# Patient Record
Sex: Male | Born: 1950 | Race: White | Hispanic: No | Marital: Married
Health system: Midwestern US, Academic
[De-identification: ages and names within clinical notes are randomized; demographics above are authoritative.]

---

## 2016-07-12 MED ORDER — EZETIMIBE 10 MG PO TAB
10 mg | ORAL_TABLET | Freq: Every day | ORAL | 3 refills | Status: DC
Start: 2016-07-12 — End: 2017-08-01

## 2016-08-07 ENCOUNTER — Ambulatory Visit: Admit: 2016-08-07 | Discharge: 2016-08-07 | Payer: MEDICARE

## 2016-08-07 DIAGNOSIS — R1310 Dysphagia, unspecified: Principal | ICD-10-CM

## 2016-08-07 NOTE — Progress Notes
Reviewed pre procedure instructions with patient, patient verbalizes good understanding.  instructed patient to avoid blood thinning medications, Celebrex, for the seven days prior to his procedure.  Patient states he is no longer taking Plavix.  Requested patient speak to Physician regarding his Insulin and to follow their instructions. Patient verbalizes Understanding and agrees.          [facility]    PATIENT EDUCATION ??? COLONOSCOPY     A colonoscopy is a scope examination of your colon or large intestine.  This is about 6 feet long in most adults.        ? A thin, flexible scope with a light and lens will be inserted through your rectum into your colon.  ? The doctor will guide the scope along your colon to the junction of the large and small intestines.  ? The test usually takes 30 minutes.    ? Unless the test is prescribed for other reasons, the main purpose of the colonoscopy is to search for polyps.                                           ? A Colonoscopy can be used to screen for cancer or precancerous polyps.  It can also be used to assess the large intestine for various other diseases.  ? If biopsies are obtained during the procedure, you will be notified of the results in approximately 14 days.   ? We will sedate you for the test by giving you medicines through an IV that will help you to relax and sleep.  We will keep you after the exam for approximately 30 minutes.  ? The doctor will speak to you before and after the procedure.  ? Unless you request otherwise, we will invite your driver to the recovery room to hear the doctor???s post-exam report.  ? The colon must be completely clean for the procedure to be both accurate and comprehensive. Please follow the preparation instructions carefully.        SPLIT DOSE NULYTELY/GOLYTELY PREP (Recommended)  PATIENT PREPARATION INSTRUCTIONS??? COLONOSCOPY    To reduce the risk of bleeding, please discuss with your prescribing physician how to stop blood thinners such as Aspirin, Ibuprofen, Plavix, Aleve, Naproxen, and Coumadin before the test.  Lovenox injections may be taken as usual through the day before the test.  Do NOT give yourself a Lovenox injection the morning of the test.  If any of these medicines have been ordered by your doctor, you MUST check with the prescribing doctor to make sure it is okay to stop them.    Discuss Diabetic medications with the prescribing doctor.  ? Please do not eat any foods that have nuts, seeds, or kernels for 7 days before your colonoscopy, as this may interfere with the quality of your test.  ? Stop iron pills 7 days prior to the test (you may continue to take multivitamins that contain iron).  ? Stop any bulk laxatives/fiber supplements such as Metamucil, Citrucel, Benefiber, etc. 7 days before the test.    ? You will need to purchase the following items (requires a prescription):                The day BEFORE the test:  08/19/2016  DO NOT EAT ANY SOLID OR CREAMED FOODS. NO RED, PURPLE OR ORANGE ITEMS ARE PERMITTED.  You may  drink all the clear liquids that you desire.                 Timeline:  ? 08/19/2016 - The day BEFORE your procedure:  Start a clear liquid diet. Drink a generous amount of clear liquids throughout the day.  No solid food or alcohol.     ? 08/19/2016 - At 11:00 AM the day BEFORE your procedure:  Fill the Nulytely/Golytely jug up to the ???fill??? line.  Place the jug in the refrigerator  ? 08/19/2016 - At 5:00 p.m. the day before your procedure:  Drink one 8 ounce glass of Nulytely/Golytely every 10 minutes until ??? of the gallon of fluid is fully consumed (approximately 3 liter). Place remaining Nulytely/Golytely in refrigerator.  ? At 7:00 AM on 08/20/2016 - 5 hours before your scheduled procedure time:   Drink one 8 ounce glass of Nulytely/Golytely every 10 minutes until there is no fluid remaining. Continue to drink lots of clear fluids. ? You may drink clear liquids up until 8:00 AM, 4 hours before your scheduled procedure time. After 8:00 AM you should have nothing by mouth including no gum or candy.  ? Chewing tobacco must be stopped 6 hours before your scheduled procedure.  If you have an early morning test, take only your essential morning medicines (heart, blood pressure, etc.) with a small sip of water by 8:00 AM.  Discuss management of diabetic medications with your prescribing physician.  On the day of the test, please report at your scheduled time to the Admissions area of the facility.  ? You will be sedated for the procedure, so a responsible adult must drive you home (no Benedetto Goad, taxis or buses are permitted).  We require that the driver stay during the time that you are here.  If you do not have a driver we will be unable to do the test.    ? You will be here for 3-4 hours from arrival time.  ? You will not be able to return to work the same day if you have received sedation.  ? Please bring a list of your current medicines and the dosages with you.    Appointment Date:  Monday, August 20, 2016 at 12:00 PM  Physician:   Dr. Virgina Organ    Appointment Arrival time:   10:30 AM, Check in at Admissions  Location:  [facility] [address] [place],  [address] [place], [place]., [address]    If you are scheduled at the [facility] and have any questions or need to reschedule, please call [phone] Option #3.   If you have completed your prep and your stools aren???t yellow or clear (without debris) call [phone] Option #3 for Main

## 2016-09-20 ENCOUNTER — Encounter: Admit: 2016-09-20 | Discharge: 2016-09-20 | Payer: MEDICARE

## 2017-01-16 LAB — LIPID PROFILE
Lab: 154 — ABNORMAL LOW (ref 98–107)
Lab: 27 — ABNORMAL HIGH (ref 80–115)
Lab: 4
Lab: 88 — ABNORMAL HIGH (ref 0.72–1.25)

## 2017-01-16 LAB — HEMOGLOBIN A1C: Lab: 9.3 — ABNORMAL HIGH (ref 4.5–6.5)

## 2017-01-16 LAB — COMPREHENSIVE METABOLIC PANEL
Lab: 0.7 — ABNORMAL LOW (ref 3.4–4.8)
Lab: 12
Lab: 12
Lab: 14
Lab: 140
Lab: 3.5
Lab: 48
Lab: 6 — ABNORMAL LOW (ref 6.2–8.1)

## 2017-01-16 LAB — PROSTATIC SPECIFIC ANTIGEN-PSA: Lab: 0.7

## 2017-01-28 LAB — CBC: Lab: 3.9 — ABNORMAL LOW (ref 4.8–10.8)

## 2017-01-28 LAB — COMPREHENSIVE METABOLIC PANEL: Lab: 134 — ABNORMAL LOW (ref 136–145)

## 2017-01-29 ENCOUNTER — Encounter: Admit: 2017-01-29 | Discharge: 2017-01-29 | Payer: MEDICARE

## 2017-05-10 LAB — COMPREHENSIVE METABOLIC PANEL
Lab: 0.5
Lab: 1.4 — ABNORMAL HIGH (ref 0.72–1.25)
Lab: 10
Lab: 102
Lab: 138
Lab: 22 — ABNORMAL LOW (ref 23–31)
Lab: 24 — ABNORMAL HIGH (ref 27.0–31.0)
Lab: 245 — ABNORMAL HIGH (ref 80–115)
Lab: 3.9
Lab: 4.2
Lab: 7

## 2017-05-10 LAB — CBC: Lab: 9

## 2017-07-16 ENCOUNTER — Encounter: Admit: 2017-07-16 | Discharge: 2017-07-16 | Payer: MEDICARE

## 2017-08-01 ENCOUNTER — Encounter: Admit: 2017-08-01 | Discharge: 2017-08-01 | Payer: MEDICARE

## 2017-08-01 ENCOUNTER — Ambulatory Visit: Admit: 2017-08-01 | Discharge: 2017-08-02 | Payer: MEDICARE

## 2017-08-01 DIAGNOSIS — I420 Dilated cardiomyopathy: ICD-10-CM

## 2017-08-01 DIAGNOSIS — E785 Hyperlipidemia, unspecified: ICD-10-CM

## 2017-08-01 DIAGNOSIS — G4733 Obstructive sleep apnea (adult) (pediatric): ICD-10-CM

## 2017-08-01 DIAGNOSIS — R0789 Other chest pain: ICD-10-CM

## 2017-08-01 DIAGNOSIS — Z9889 Other specified postprocedural states: ICD-10-CM

## 2017-08-01 DIAGNOSIS — M1990 Unspecified osteoarthritis, unspecified site: ICD-10-CM

## 2017-08-01 DIAGNOSIS — E119 Type 2 diabetes mellitus without complications: ICD-10-CM

## 2017-08-01 DIAGNOSIS — I509 Heart failure, unspecified: ICD-10-CM

## 2017-08-01 DIAGNOSIS — I2510 Atherosclerotic heart disease of native coronary artery without angina pectoris: ICD-10-CM

## 2017-08-02 LAB — COMPREHENSIVE METABOLIC PANEL
Lab: 0.4
Lab: 106
Lab: 141
Lab: 15 — ABNORMAL HIGH (ref 0–14)
Lab: 18
Lab: 23
Lab: 25
Lab: 25
Lab: 3.7
Lab: 4.5

## 2017-08-02 LAB — LIPID PROFILE
Lab: 125 — ABNORMAL LOW (ref 150–200)
Lab: 3
Lab: 49
Lab: 55 — ABNORMAL HIGH (ref 80–115)
Lab: 61

## 2017-08-07 ENCOUNTER — Ambulatory Visit: Admit: 2017-08-07 | Discharge: 2017-08-08 | Payer: MEDICARE

## 2017-08-07 ENCOUNTER — Encounter: Admit: 2017-08-07 | Discharge: 2017-08-07 | Payer: MEDICARE

## 2017-08-07 DIAGNOSIS — E119 Type 2 diabetes mellitus without complications: ICD-10-CM

## 2017-08-07 DIAGNOSIS — I2510 Atherosclerotic heart disease of native coronary artery without angina pectoris: ICD-10-CM

## 2017-08-07 DIAGNOSIS — R0789 Other chest pain: Principal | ICD-10-CM

## 2017-08-07 DIAGNOSIS — I420 Dilated cardiomyopathy: Principal | ICD-10-CM

## 2017-08-07 DIAGNOSIS — E785 Hyperlipidemia, unspecified: ICD-10-CM

## 2017-08-12 ENCOUNTER — Encounter: Admit: 2017-08-12 | Discharge: 2017-08-12 | Payer: MEDICARE

## 2017-10-10 ENCOUNTER — Ambulatory Visit: Admit: 2017-10-10 | Discharge: 2017-10-11 | Payer: MEDICARE

## 2017-10-10 DIAGNOSIS — I420 Dilated cardiomyopathy: Principal | ICD-10-CM

## 2017-10-10 DIAGNOSIS — I2510 Atherosclerotic heart disease of native coronary artery without angina pectoris: ICD-10-CM

## 2017-10-11 ENCOUNTER — Encounter: Admit: 2017-10-11 | Discharge: 2017-10-11 | Payer: MEDICARE

## 2017-10-11 DIAGNOSIS — I2510 Atherosclerotic heart disease of native coronary artery without angina pectoris: ICD-10-CM

## 2017-10-11 DIAGNOSIS — I420 Dilated cardiomyopathy: Principal | ICD-10-CM

## 2019-09-10 ENCOUNTER — Encounter: Admit: 2019-09-10 | Discharge: 2019-09-10 | Payer: MEDICARE

## 2019-09-10 ENCOUNTER — Ambulatory Visit: Admit: 2019-09-10 | Discharge: 2019-09-10 | Payer: MEDICARE

## 2019-09-10 DIAGNOSIS — I509 Heart failure, unspecified: Secondary | ICD-10-CM

## 2019-09-10 DIAGNOSIS — R109 Unspecified abdominal pain: Principal | ICD-10-CM

## 2019-09-10 DIAGNOSIS — I420 Dilated cardiomyopathy: Secondary | ICD-10-CM

## 2019-09-10 DIAGNOSIS — E119 Type 2 diabetes mellitus without complications: Secondary | ICD-10-CM

## 2019-09-10 DIAGNOSIS — R0789 Other chest pain: Secondary | ICD-10-CM

## 2019-09-10 DIAGNOSIS — M1990 Unspecified osteoarthritis, unspecified site: Secondary | ICD-10-CM

## 2019-09-10 DIAGNOSIS — G4733 Obstructive sleep apnea (adult) (pediatric): Secondary | ICD-10-CM

## 2019-09-10 DIAGNOSIS — E785 Hyperlipidemia, unspecified: Secondary | ICD-10-CM

## 2019-09-10 DIAGNOSIS — K529 Noninfective gastroenteritis and colitis, unspecified: Secondary | ICD-10-CM

## 2019-09-10 DIAGNOSIS — Z9889 Other specified postprocedural states: Secondary | ICD-10-CM

## 2019-09-11 LAB — COMPREHENSIVE METABOLIC PANEL
Lab: 0.5 mg/dL (ref 0.3–1.2)
Lab: 1.4 mg/dL — ABNORMAL HIGH (ref 0.4–1.24)
Lab: 10 U/L (ref 7–56)
Lab: 104 MMOL/L (ref 98–110)
Lab: 11 10*3/uL (ref 3–12)
Lab: 11 U/L (ref 7–40)
Lab: 140 MMOL/L — ABNORMAL LOW (ref 137–147)
Lab: 188 mg/dL — ABNORMAL HIGH (ref 70–100)
Lab: 25 MMOL/L (ref 21–30)
Lab: 29 mg/dL — ABNORMAL HIGH (ref 7–25)
Lab: 4.2 g/dL (ref 3.5–5.0)
Lab: 49 U/L (ref 25–110)
Lab: 5 MMOL/L — ABNORMAL LOW (ref 3.5–5.1)
Lab: 50 mL/min — ABNORMAL LOW (ref >60)
Lab: 6.6 g/dL (ref 6.0–8.0)
Lab: 9.2 mg/dL (ref 8.5–10.6)
Lab: >60 mL/min (ref >60)

## 2019-09-11 LAB — CBC AND DIFF
Lab: 0 10*3/uL (ref 0–0.20)
Lab: 4.1 M/UL — ABNORMAL LOW (ref 4.4–5.5)
Lab: 5.7 10*3/uL (ref 4.5–11.0)

## 2019-09-15 ENCOUNTER — Encounter: Admit: 2019-09-15 | Discharge: 2019-09-15 | Payer: MEDICARE

## 2019-09-16 ENCOUNTER — Ambulatory Visit: Admit: 2019-09-16 | Discharge: 2019-09-16 | Payer: MEDICARE

## 2019-09-16 ENCOUNTER — Encounter: Admit: 2019-09-16 | Discharge: 2019-09-16 | Payer: MEDICARE

## 2019-09-16 DIAGNOSIS — K529 Noninfective gastroenteritis and colitis, unspecified: Secondary | ICD-10-CM

## 2019-09-16 DIAGNOSIS — R109 Unspecified abdominal pain: Secondary | ICD-10-CM

## 2019-09-16 LAB — POC CREATININE, RAD: Lab: 1.5 mg/dL — ABNORMAL HIGH (ref 0.4–1.24)

## 2019-09-16 MED ORDER — IOHEXOL 350 MG IODINE/ML IV SOLN
100 mL | Freq: Once | INTRAVENOUS | 0 refills | Status: CP
Start: 2019-09-16 — End: ?
  Administered 2019-09-16: 19:00:00 100 mL via INTRAVENOUS

## 2019-09-16 MED ORDER — SODIUM CHLORIDE 0.9 % IJ SOLN
50 mL | Freq: Once | INTRAVENOUS | 0 refills | Status: CP
Start: 2019-09-16 — End: ?
  Administered 2019-09-16: 19:00:00 50 mL via INTRAVENOUS

## 2019-09-20 ENCOUNTER — Encounter: Admit: 2019-09-20 | Discharge: 2019-09-20 | Payer: MEDICARE

## 2019-09-21 ENCOUNTER — Encounter: Admit: 2019-09-21 | Discharge: 2019-09-21 | Payer: MEDICARE

## 2019-09-21 DIAGNOSIS — R109 Unspecified abdominal pain: Secondary | ICD-10-CM

## 2019-09-21 MED ORDER — LIPASE-PROTEASE-AMYLASE (CREON 36,000) 36,000-114,000- 180,000 UNIT PO CPDR
ORAL_CAPSULE | Freq: Three times a day (TID) | ORAL | 1 refills | 30.00000 days | Status: AC
Start: 2019-09-21 — End: ?

## 2019-09-25 ENCOUNTER — Encounter: Admit: 2019-09-25 | Discharge: 2019-09-25 | Payer: MEDICARE

## 2019-09-25 NOTE — Telephone Encounter
Patient called asking to schedule appt with [facility] for further evaluation of chest discomfort/burning.  Pt was seen and evaluated today by PCP and was advised to see SDO.      Pt reports onset of intermittent chest discomfort approx 2 weeks ago.  He reports some dyspnea.  Pain is intermittent.  Pain doesn't radiate. Pt scheduled to see SDO next week in [place] for further evaluation.  Advised wife, if sx increase to go to ER for evaluation. She verbalizes understanding. BP today at PCP's office was 112/70.

## 2019-10-01 ENCOUNTER — Encounter: Admit: 2019-10-01 | Discharge: 2019-10-01 | Payer: MEDICARE

## 2019-10-01 DIAGNOSIS — R0789 Other chest pain: Secondary | ICD-10-CM

## 2019-10-01 DIAGNOSIS — E785 Hyperlipidemia, unspecified: Secondary | ICD-10-CM

## 2019-10-01 DIAGNOSIS — M1990 Unspecified osteoarthritis, unspecified site: Secondary | ICD-10-CM

## 2019-10-01 DIAGNOSIS — G4733 Obstructive sleep apnea (adult) (pediatric): Secondary | ICD-10-CM

## 2019-10-01 DIAGNOSIS — I2510 Atherosclerotic heart disease of native coronary artery without angina pectoris: Secondary | ICD-10-CM

## 2019-10-01 DIAGNOSIS — I509 Heart failure, unspecified: Secondary | ICD-10-CM

## 2019-10-01 DIAGNOSIS — Z9889 Other specified postprocedural states: Secondary | ICD-10-CM

## 2019-10-01 DIAGNOSIS — I420 Dilated cardiomyopathy: Secondary | ICD-10-CM

## 2019-10-01 DIAGNOSIS — E119 Type 2 diabetes mellitus without complications: Secondary | ICD-10-CM

## 2019-10-01 DIAGNOSIS — R079 Chest pain, unspecified: Secondary | ICD-10-CM

## 2019-10-01 DIAGNOSIS — R0600 Dyspnea, unspecified: Secondary | ICD-10-CM

## 2019-10-02 ENCOUNTER — Encounter: Admit: 2019-10-02 | Discharge: 2019-10-02 | Payer: MEDICARE

## 2019-12-24 ENCOUNTER — Encounter: Admit: 2019-12-24 | Discharge: 2019-12-24 | Payer: MEDICARE

## 2019-12-24 ENCOUNTER — Ambulatory Visit: Admit: 2019-12-24 | Discharge: 2019-12-25 | Payer: MEDICARE

## 2019-12-24 DIAGNOSIS — G4733 Obstructive sleep apnea (adult) (pediatric): Secondary | ICD-10-CM

## 2019-12-24 DIAGNOSIS — M1990 Unspecified osteoarthritis, unspecified site: Secondary | ICD-10-CM

## 2019-12-24 DIAGNOSIS — E119 Type 2 diabetes mellitus without complications: Secondary | ICD-10-CM

## 2019-12-24 DIAGNOSIS — R109 Unspecified abdominal pain: Secondary | ICD-10-CM

## 2019-12-24 DIAGNOSIS — R0789 Other chest pain: Secondary | ICD-10-CM

## 2019-12-24 DIAGNOSIS — N289 Disorder of kidney and ureter, unspecified: Secondary | ICD-10-CM

## 2019-12-24 DIAGNOSIS — E785 Hyperlipidemia, unspecified: Secondary | ICD-10-CM

## 2019-12-24 DIAGNOSIS — I509 Heart failure, unspecified: Secondary | ICD-10-CM

## 2019-12-24 DIAGNOSIS — Z9889 Other specified postprocedural states: Secondary | ICD-10-CM

## 2019-12-24 DIAGNOSIS — I420 Dilated cardiomyopathy: Secondary | ICD-10-CM

## 2019-12-24 NOTE — Patient Instructions
Complete lab.     Follow up with Dr. Guss Bunde in 6 months. You will be contacted to schedule this appointment by a patient service representative. If you are not contacted in the next 2 weeks please call our office for assistance.     As part of the CARES act, starting April 1st some results are released to you automatically. Your provider will continue to send you a detailed result note on any labs that they order, but with these changes you may see your results before they do. Critical lab results will be addressed immediately, but otherwise please give your provider 72 hours (3 business days) to view and respond to your results before reaching out with any questions. Depending on your questions, they may ask you to schedule a telehealth or telephone visit to discuss further. This visit may be billed to your insurance depending on time and complexity.     If you have internet access/ smartphone please contact me through MyChart. This is our preferred method of contact and the most efficient way to contact your healthcare team. If you do not have internet access/smartphone you can call at [phone]. Please do not leave multiple voicemail's for [place]. Leaving multiple voicemail's delays [place] getting back to you quicker.

## 2020-09-22 ENCOUNTER — Encounter: Admit: 2020-09-22 | Discharge: 2020-09-22 | Payer: MEDICARE

## 2020-09-22 ENCOUNTER — Ambulatory Visit: Admit: 2020-09-22 | Discharge: 2020-09-23 | Payer: MEDICARE

## 2020-09-22 DIAGNOSIS — E119 Type 2 diabetes mellitus without complications: Secondary | ICD-10-CM

## 2020-09-22 DIAGNOSIS — I420 Dilated cardiomyopathy: Secondary | ICD-10-CM

## 2020-09-22 DIAGNOSIS — I509 Heart failure, unspecified: Secondary | ICD-10-CM

## 2020-09-22 DIAGNOSIS — E785 Hyperlipidemia, unspecified: Secondary | ICD-10-CM

## 2020-09-22 DIAGNOSIS — M1990 Unspecified osteoarthritis, unspecified site: Secondary | ICD-10-CM

## 2020-09-22 DIAGNOSIS — G4733 Obstructive sleep apnea (adult) (pediatric): Secondary | ICD-10-CM

## 2020-09-22 DIAGNOSIS — Z9889 Other specified postprocedural states: Secondary | ICD-10-CM

## 2020-09-22 DIAGNOSIS — K8689 Other specified diseases of pancreas: Secondary | ICD-10-CM

## 2020-09-22 DIAGNOSIS — K529 Noninfective gastroenteritis and colitis, unspecified: Secondary | ICD-10-CM

## 2020-09-22 DIAGNOSIS — R0789 Other chest pain: Secondary | ICD-10-CM

## 2020-09-22 MED ORDER — LIPASE-PROTEASE-AMYLASE (CREON 36,000) 36,000-114,000- 180,000 UNIT PO CPDR
ORAL_CAPSULE | Freq: Three times a day (TID) | ORAL | 1 refills | 30.00000 days | Status: AC
Start: 2020-09-22 — End: ?
  Filled 2020-09-23: 30d supply

## 2020-09-23 ENCOUNTER — Encounter: Admit: 2020-09-23 | Discharge: 2020-09-23 | Payer: MEDICARE

## 2020-09-23 ENCOUNTER — Ambulatory Visit: Admit: 2020-09-23 | Discharge: 2020-09-23 | Payer: MEDICARE

## 2020-09-23 DIAGNOSIS — K529 Noninfective gastroenteritis and colitis, unspecified: Secondary | ICD-10-CM

## 2020-09-23 DIAGNOSIS — K8689 Other specified diseases of pancreas: Secondary | ICD-10-CM

## 2020-09-26 ENCOUNTER — Encounter: Admit: 2020-09-26 | Discharge: 2020-09-26 | Payer: MEDICARE

## 2020-09-26 DIAGNOSIS — I420 Dilated cardiomyopathy: Secondary | ICD-10-CM

## 2020-09-26 DIAGNOSIS — M1990 Unspecified osteoarthritis, unspecified site: Secondary | ICD-10-CM

## 2020-09-26 DIAGNOSIS — I509 Heart failure, unspecified: Secondary | ICD-10-CM

## 2020-09-26 DIAGNOSIS — M4802 Spinal stenosis, cervical region: Secondary | ICD-10-CM

## 2020-09-26 DIAGNOSIS — E119 Type 2 diabetes mellitus without complications: Secondary | ICD-10-CM

## 2020-09-26 DIAGNOSIS — G4733 Obstructive sleep apnea (adult) (pediatric): Secondary | ICD-10-CM

## 2020-09-26 DIAGNOSIS — E785 Hyperlipidemia, unspecified: Secondary | ICD-10-CM

## 2020-09-26 DIAGNOSIS — Z9889 Other specified postprocedural states: Secondary | ICD-10-CM

## 2020-09-26 DIAGNOSIS — R0789 Other chest pain: Secondary | ICD-10-CM

## 2020-09-26 DIAGNOSIS — G40409 Other generalized epilepsy and epileptic syndromes, not intractable, without status epilepticus: Secondary | ICD-10-CM

## 2020-10-04 ENCOUNTER — Encounter: Admit: 2020-10-04 | Discharge: 2020-10-04 | Payer: MEDICARE

## 2020-10-04 DIAGNOSIS — K529 Noninfective gastroenteritis and colitis, unspecified: Secondary | ICD-10-CM

## 2020-10-04 DIAGNOSIS — K8689 Other specified diseases of pancreas: Secondary | ICD-10-CM

## 2020-10-04 LAB — BASIC METABOLIC PANEL
CHLORIDE: 104
CO2: 24
CREATININE: 1.4 — ABNORMAL HIGH (ref 0.72–1.25)
POTASSIUM: 5.5 — ABNORMAL HIGH (ref 3.5–5.1)

## 2020-10-04 LAB — LIPID PROFILE
CHOLESTEROL/HDL %: 3
LDL: 64 — ABNORMAL HIGH (ref 70–105)
TRIGLYCERIDES: 140 — ABNORMAL HIGH (ref 8.4–25.7)

## 2020-10-06 ENCOUNTER — Encounter: Admit: 2020-10-06 | Discharge: 2020-10-06 | Payer: MEDICARE

## 2020-10-06 ENCOUNTER — Ambulatory Visit: Admit: 2020-10-06 | Discharge: 2020-10-06 | Payer: MEDICARE

## 2020-10-06 DIAGNOSIS — I10 Essential (primary) hypertension: Secondary | ICD-10-CM

## 2020-10-06 DIAGNOSIS — R079 Chest pain, unspecified: Secondary | ICD-10-CM

## 2020-10-06 DIAGNOSIS — I420 Dilated cardiomyopathy: Secondary | ICD-10-CM

## 2020-10-06 DIAGNOSIS — E785 Hyperlipidemia, unspecified: Secondary | ICD-10-CM

## 2020-10-06 DIAGNOSIS — G4733 Obstructive sleep apnea (adult) (pediatric): Secondary | ICD-10-CM

## 2020-10-06 DIAGNOSIS — I2510 Atherosclerotic heart disease of native coronary artery without angina pectoris: Secondary | ICD-10-CM

## 2020-10-06 DIAGNOSIS — E119 Type 2 diabetes mellitus without complications: Secondary | ICD-10-CM

## 2020-10-06 NOTE — Assessment & Plan Note
He continues to struggle with his weight.  He sees Dr. Threasa Beards at [facility] for management of his diabetes.

## 2020-10-06 NOTE — Assessment & Plan Note
He underwent coronary arteriography 5 years ago and was found to have mild to moderate coronary disease.  Medical management has been recommended.  I do not think he is having angina symptoms currently.  He had a surveillance stress test earlier today.

## 2020-10-06 NOTE — Assessment & Plan Note
He has lost weight since the original diagnosis 20 years ago.  His wife does not think that he has apnea nor does he snore loudly at this point.

## 2020-10-06 NOTE — Assessment & Plan Note
We did an echocardiogram today.  I do not believe that he has manifestations of heart failure today.  His volume status appears to be stable.  He has some degree of exertional breathlessness but I definitely think it is more related to deconditioning than to heart failure.

## 2020-10-06 NOTE — Assessment & Plan Note
Lab Results   Component Value Date    CHOL 131 10/04/2020    TRIG 140 10/04/2020    HDL 39 (L) 10/04/2020    LDL 64 10/04/2020    VLDL 28 10/04/2020    CHOLHDLC 3 10/04/2020      LDL treated to goal.

## 2020-10-06 NOTE — Progress Notes
Date of Service: 10/06/2020    Karl Knapp is a 70 y.o. male.       HPI     It was good to see Karl Knapp in [facility] today.  He has been a patient of mine now for a little over 35 years!    He had surveillance echocardiography and stress testing this morning.  He struggles with his weight and with management of his diabetes.  He is not having any particular cardiovascular symptoms at this time.  He denies problems with exertional chest discomfort or breathlessness.  He has had no difficulties with syncope, near syncope, or palpitations.  He denies any TIA or stroke symptoms.    He has a history of obstructive sleep apnea but has lost weight since that time.  He never could tolerate positive pressure ventilation and was never really treated but he does not seem to have symptoms now.    He says that he is tired all the time and that he feels that he sleeps too much, perhaps because of all of the medication that he takes.    He is just about finished with a 1971 GTO that he has been [place] over the past 4 years.  He showed me photos today and it is really going to be a beautiful automobile.         Vitals:    10/06/20 1001   BP: 120/68   BP Source: Arm, Right Upper   Pulse: 74   SpO2: 95%   O2 Percent: 95 %   O2 Device: None (Room air)   PainSc: Five   Weight: 87 kg (191 lb 12.8 oz)   Height: 182.9 cm (6')     Body mass index is 26.01 kg/m?Marland Kitchen     Past Medical History  Patient Active Problem List    Diagnosis Date Noted   ? Intolerance of continuous positive airway pressure (CPAP) ventilation 06/14/2016   ? Coronary artery disease due to calcified coronary lesion 08/04/2015     05/24/2015 - Cardiac Cath ([facility]):  L main normal, LAD mid-40%, mid-RCA 30%.  Distal RPD 85% (tortuous vessel).  Risk factor management recommended.     ? Syncope and collapse 05/02/2014   ? Acute encephalopathy 11/16/2013   ? Abdominal aortic atherosclerosis (HCC) 03/28/2010     03/2010 - incidental finding on CT-abdomen done for abdominal pain.  No AAA.     ? Screening for cardiovascular condition 03/28/2010   ? Erectile dysfunction 02/23/2009   ? Diabetes mellitus type 2, insulin dependent (HCC) 02/22/2009   ? Arthritis 02/22/2009   ? History of back surgery 02/22/2009     Removed discs w/fusion 5/10     ? Atypical chest pain 01/26/2009       4/00 - Cardiac cath: normal coronaries, EF 40 to 45%.        ? 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          1989, 4/00 - Normal coronary arteries by catheterization.          1989 - RV biopsy demonstrating mild interstitial fibrosis and myocyte hypertrophy,                     no acute myocarditis.          Treatment with ACE inhibitor, digoxin - EF 40% - Class 2 CHF symptoms.  3/03 - EF 50-55% per echo.          6/05 - EXED: EF 45%, mild LAE, mild MR and TR, non-ischemic.     ? Hyperlipidemia 01/26/2009     1999 - Zocor initiated.          6/05 - Vytorin initiated.     ? Sleep apnea, obstructive 01/26/2009      8/01 - Sleep study at [facility]. CPAP therapy recommended.          Intolerant of CPAP--insurance company denied heated humidifier.          8/08 -  Positive sleep study at [facility], nasal CPAP started with good result     ? Anxiety 01/26/2009         Review of Systems   Constitutional: Positive for malaise/fatigue.   HENT: Positive for tinnitus.    Eyes: Negative.    Cardiovascular: Positive for dyspnea on exertion.   Respiratory: Positive for cough and shortness of breath.    Endocrine: Negative.    Hematologic/Lymphatic: Negative.    Skin: Negative.    Musculoskeletal: Positive for back pain, falls, joint pain, muscle cramps, muscle weakness, myalgias and neck pain.   Gastrointestinal: Positive for dysphagia.   Genitourinary: Negative.    Neurological: Positive for excessive daytime sleepiness, dizziness, headaches, light-headedness and weakness.   Psychiatric/Behavioral: Negative.    Allergic/Immunologic: Negative. Physical Exam    Physical Exam   General Appearance: no distress   Skin: warm, no ulcers or xanthomas   Digits and Nails: no cyanosis or clubbing   Eyes: conjunctivae and lids normal, pupils are equal and round   Teeth/Gums/Palate: dentition unremarkable, no lesions   Lips & Oral Mucosa: no pallor or cyanosis   Neck Veins: normal JVP , neck veins are not distended   Thyroid: no nodules, masses, tenderness or enlargement   Chest Inspection: chest is normal in appearance   Respiratory Effort: breathing comfortably, no respiratory distress   Auscultation/Percussion: lungs clear to auscultation, no rales or rhonchi, no wheezing   PMI: PMI not enlarged or displaced   Cardiac Rhythm: regular rhythm and normal rate   Cardiac Auscultation: S1, S2 normal, no rub, no gallop   Murmurs: no murmur   Peripheral Circulation: normal peripheral circulation   Carotid Arteries: normal carotid upstroke bilaterally, no bruits   Radial Arteries: normal symmetric radial pulses   Abdominal Aorta: no abdominal aortic bruit   Pedal Pulses: normal symmetric pedal pulses   Lower Extremity Edema: no lower extremity edema   Abdominal Exam: soft, non-tender, no masses, bowel sounds normal   Liver & Spleen: no organomegaly   Gait & Station: walks without assistance   Muscle Strength: normal muscle tone   Orientation: oriented to time, place and person   Affect & Mood: appropriate and sustained affect   Language and Memory: patient responsive and seems to comprehend information   Neurologic Exam: neurological assessment grossly intact   Other: moves all extremities      Cardiovascular Health Factors  Vitals BP Readings from Last 3 Encounters:   10/06/20 122/62   10/06/20 120/68   09/22/20 (!) 150/79     Wt Readings from Last 3 Encounters:   10/06/20 88 kg (194 lb 0.1 oz)   10/06/20 87 kg (191 lb 12.8 oz)   09/22/20 85.3 kg (188 lb)     BMI Readings from Last 3 Encounters:   10/06/20 26.31 kg/m?   10/06/20 26.01 kg/m?   09/22/20 25.50 kg/m? Smoking Social  History     Tobacco Use   Smoking Status Never Smoker   Smokeless Tobacco Never Used      Lipid Profile Cholesterol   Date Value Ref Range Status   10/04/2020 131  Final     HDL   Date Value Ref Range Status   10/04/2020 39 (L) >40 Final     LDL   Date Value Ref Range Status   10/04/2020 64  Final     Triglycerides   Date Value Ref Range Status   10/04/2020 140  Final      Blood Sugar Hemoglobin A1C   Date Value Ref Range Status   08/09/2020 9.0 (H) <5.7 Final     Glucose   Date Value Ref Range Status   10/04/2020 263 (H) 70 - 105 Final   08/09/2020 311 (H) 70 - 105 Final   09/10/2019 188 (H) 70 - 100 MG/DL Final     Glucose, POC   Date Value Ref Range Status   11/17/2013 220 (H) 70 - 100 MG/DL Final   16/12/9602 97 70 - 100 MG/DL Final   54/11/8117 147 (H) 70 - 100 MG/DL Final          Problems Addressed Today  Encounter Diagnoses   Name Primary?   ? Primary idiopathic dilated cardiomyopathy (HCC)    ? Hyperlipidemia, unspecified hyperlipidemia type    ? Coronary artery disease due to calcified coronary lesion    ? Sleep apnea, obstructive    ? Diabetes mellitus type 2, insulin dependent (HCC)        Assessment and Plan       Primary idiopathic dilated cardiomyopathy (HCC)  We did an echocardiogram today.  I do not believe that he has manifestations of heart failure today.  His volume status appears to be stable.  He has some degree of exertional breathlessness but I definitely think it is more related to deconditioning than to heart failure.    Hyperlipidemia  Lab Results   Component Value Date    CHOL 131 10/04/2020    TRIG 140 10/04/2020    HDL 39 (L) 10/04/2020    LDL 64 10/04/2020    VLDL 28 10/04/2020    CHOLHDLC 3 10/04/2020      LDL treated to goal.    Coronary artery disease due to calcified coronary lesion  He underwent coronary arteriography 5 years ago and was found to have mild to moderate coronary disease.  Medical management has been recommended.  I do not think he is having angina symptoms currently.  He had a surveillance stress test earlier today.    Sleep apnea, obstructive  He has lost weight since the original diagnosis 20 years ago.  His wife does not think that he has apnea nor does he snore loudly at this point.    Diabetes mellitus type 2, insulin dependent (HCC)  He continues to struggle with his weight.  He sees Dr. Threasa Beards at [facility] for management of his diabetes.      Current Medications (including today's revisions)  ? acyclovir (ZOVIRAX) 400 mg tablet Take 400 mg by mouth every 24 hours.   ? amLODIPine (NORVASC) 10 mg tablet Take 10 mg by mouth daily.   ? atorvastatin (LIPITOR) 40 mg tablet Take 40 mg by mouth at bedtime daily.   ? baclofen (LIORESAL) 10 mg tablet Take 10 mg by mouth twice daily.   ? carvedilol (COREG) 25 mg tablet Take 25 mg by mouth twice daily with  meals. Take with food.   ? celecoxib (CELEBREX) 200 mg capsule Take 200 mg by mouth daily.   ? cholecalciferol (VITAMIN D-3) 1,000 units tablet Take 1,000 Units by mouth daily.   ? divalproex (DEPAKOTE ER) 500 mg ER tablet Take 500 mg by mouth three times daily. Take with food.   ? escitalopram oxalate (LEXAPRO) 20 mg tablet Take 20 mg by mouth twice daily.   ? gabapentin (NEURONTIN) 300 mg capsule Take 300 mg by mouth twice daily.   ? HYDROcodone/acetaminophen(+) (NORCO) 10/325 mg tablet Take 1 tablet by mouth at bedtime daily.   ? insulin detemir(+) (LEVEMIR FLEXTOUCH U-100 INSULN) 100 unit/mL (3 mL) injection pen Inject 6-11 Units under the skin twice daily.   ? insulin lispro(+) (HUMALOG KWIKPEN INSULIN) 100 unit/mL injection PEN Inject 42 Units under the skin twice daily with meals.   ? lipase-protease-amylase (CREON 36,000) 36,000-114,000- 180,000 unit capsule Take two capsules by mouth three times daily with meals AND one capsule twice daily. Take one capsule with snacks   ? lisinopril (PRINIVIL; ZESTRIL) 20 mg tablet Take 1 Tab by mouth daily.   ? naloxone (NARCAN) 4 mg/actuation nasal spray Insert 4 mg into nose as directed as Needed.   ? ondansetron (ZOFRAN ODT) 4 mg rapid dissolve tablet Dissolve  by mouth as Needed.   ? oxyCODONE (ROXICODONE) 30 mg tablet Take 1 tablet by mouth as Needed.   ? pantoprazole DR (PROTONIX) 40 mg tablet Take 40 mg by mouth daily.   ? promazine HCl (PROMAZINE PO) Take 25 mg by mouth.   ? SITagliptin-metformin (JANUMET XR) 50-1,000 mg XR tablet Take 1 tablet by mouth twice daily.     Total time spent on today's office visit was 45 minutes.  This includes face-to-face in person visit with patient as well as nonface-to-face time including review of the EMR, outside records, labs, radiologic studies, echocardiogram & other cardiovascular studies, formation of treatment plan, after visit summary, future disposition, and lastly on documentation.

## 2020-10-07 ENCOUNTER — Encounter: Admit: 2020-10-07 | Discharge: 2020-10-07 | Payer: MEDICARE

## 2020-10-07 NOTE — Telephone Encounter
Results and recommendations called to patient lmom requested call back if questions

## 2020-10-07 NOTE — Telephone Encounter
-----   Message from Vanice Sarah, MD sent at 10/07/2020  8:26 AM CDT -----  [facility], this is stable compared to prior echo.  Thanks!    Cc:  Dr. Tonna Corner

## 2020-10-07 NOTE — Telephone Encounter
-----   Message from Vanice Sarah, MD sent at 10/07/2020  8:27 AM CDT -----  [facility], the stress test looks OK for Gedeon.  Same EF as the echo.  Thanks.    Cc:  Dr. Tonna Corner

## 2020-10-10 ENCOUNTER — Encounter: Admit: 2020-10-10 | Discharge: 2020-10-10 | Payer: MEDICARE

## 2020-10-10 DIAGNOSIS — K8689 Other specified diseases of pancreas: Secondary | ICD-10-CM

## 2020-10-10 DIAGNOSIS — K529 Noninfective gastroenteritis and colitis, unspecified: Secondary | ICD-10-CM

## 2020-10-10 LAB — 25-OH VITAMIN D (D2 + D3): VITAMIN D (25-OH) TOTAL: 22

## 2020-10-12 ENCOUNTER — Encounter: Admit: 2020-10-12 | Discharge: 2020-10-12 | Payer: MEDICARE

## 2020-10-12 NOTE — Progress Notes
Mailed copy of EUS prep instructions to pt's home address.

## 2020-10-21 ENCOUNTER — Encounter: Admit: 2020-10-21 | Discharge: 2020-10-21 | Payer: MEDICARE

## 2020-10-21 DIAGNOSIS — N289 Disorder of kidney and ureter, unspecified: Secondary | ICD-10-CM

## 2020-10-21 DIAGNOSIS — K529 Noninfective gastroenteritis and colitis, unspecified: Secondary | ICD-10-CM

## 2020-10-27 ENCOUNTER — Encounter: Admit: 2020-10-27 | Discharge: 2020-10-27 | Payer: MEDICARE

## 2020-10-27 MED ORDER — ERGOCALCIFEROL (VITAMIN D2) 1,250 MCG (50,000 UNIT) PO CAP
1 | ORAL_CAPSULE | ORAL | 0 refills | 56.00000 days | Status: AC
Start: 2020-10-27 — End: ?

## 2020-10-27 NOTE — Telephone Encounter
Prescription sent to patients preferred pharmacy per below.

## 2021-01-29 ENCOUNTER — Encounter: Admit: 2021-01-29 | Discharge: 2021-01-29 | Payer: MEDICARE

## 2021-02-02 ENCOUNTER — Encounter: Admit: 2021-02-02 | Discharge: 2021-02-02 | Payer: MEDICARE

## 2021-02-02 MED ORDER — ERGOCALCIFEROL (VITAMIN D2) 1,250 MCG (50,000 UNIT) PO CAP
ORAL_CAPSULE | 0 refills
Start: 2021-02-02 — End: ?

## 2021-03-02 ENCOUNTER — Encounter: Admit: 2021-03-02 | Discharge: 2021-03-02 | Payer: MEDICARE

## 2021-03-02 MED ORDER — ERGOCALCIFEROL (VITAMIN D2) 1,250 MCG (50,000 UNIT) PO CAP
ORAL_CAPSULE | 0 refills
Start: 2021-03-02 — End: ?

## 2021-07-03 ENCOUNTER — Encounter: Admit: 2021-07-03 | Discharge: 2021-07-03 | Payer: MEDICARE

## 2021-07-03 MED ORDER — ERGOCALCIFEROL (VITAMIN D2) 1,250 MCG (50,000 UNIT) PO CAP
ORAL_CAPSULE | 0 refills
Start: 2021-07-03 — End: ?

## 2021-07-06 ENCOUNTER — Encounter: Admit: 2021-07-06 | Discharge: 2021-07-06 | Payer: MEDICARE

## 2021-07-06 ENCOUNTER — Ambulatory Visit: Admit: 2021-07-06 | Discharge: 2021-07-07 | Payer: MEDICARE

## 2021-07-06 ENCOUNTER — Ambulatory Visit: Admit: 2021-07-06 | Discharge: 2021-07-06 | Payer: MEDICARE

## 2021-07-06 DIAGNOSIS — Z9889 Other specified postprocedural states: Secondary | ICD-10-CM

## 2021-07-06 DIAGNOSIS — M1990 Unspecified osteoarthritis, unspecified site: Secondary | ICD-10-CM

## 2021-07-06 DIAGNOSIS — E119 Type 2 diabetes mellitus without complications: Secondary | ICD-10-CM

## 2021-07-06 DIAGNOSIS — R112 Nausea with vomiting, unspecified: Secondary | ICD-10-CM

## 2021-07-06 DIAGNOSIS — R1115 Cyclical vomiting syndrome unrelated to migraine: Secondary | ICD-10-CM

## 2021-07-06 DIAGNOSIS — N189 Chronic kidney disease, unspecified: Secondary | ICD-10-CM

## 2021-07-06 DIAGNOSIS — I509 Heart failure, unspecified: Secondary | ICD-10-CM

## 2021-07-06 DIAGNOSIS — E785 Hyperlipidemia, unspecified: Secondary | ICD-10-CM

## 2021-07-06 DIAGNOSIS — G4733 Obstructive sleep apnea (adult) (pediatric): Secondary | ICD-10-CM

## 2021-07-06 DIAGNOSIS — M4802 Spinal stenosis, cervical region: Secondary | ICD-10-CM

## 2021-07-06 DIAGNOSIS — E8729 Ketoacidosis: Secondary | ICD-10-CM

## 2021-07-06 DIAGNOSIS — R0789 Other chest pain: Secondary | ICD-10-CM

## 2021-07-06 DIAGNOSIS — G40409 Other generalized epilepsy and epileptic syndromes, not intractable, without status epilepticus: Secondary | ICD-10-CM

## 2021-07-06 DIAGNOSIS — I420 Dilated cardiomyopathy: Secondary | ICD-10-CM

## 2021-07-06 MED ORDER — LORAZEPAM 1 MG PO TAB
ORAL_TABLET | ORAL | 0 refills | 12.00000 days | Status: AC
Start: 2021-07-06 — End: ?

## 2021-07-07 ENCOUNTER — Encounter: Admit: 2021-07-07 | Discharge: 2021-07-07 | Payer: MEDICARE

## 2021-07-07 DIAGNOSIS — R1115 Cyclical vomiting syndrome unrelated to migraine: Secondary | ICD-10-CM

## 2021-07-07 DIAGNOSIS — R112 Nausea with vomiting, unspecified: Secondary | ICD-10-CM

## 2021-08-04 ENCOUNTER — Encounter: Admit: 2021-08-04 | Discharge: 2021-08-04 | Payer: MEDICARE

## 2021-08-14 ENCOUNTER — Encounter: Admit: 2021-08-14 | Discharge: 2021-08-14 | Payer: MEDICARE

## 2021-08-18 ENCOUNTER — Encounter: Admit: 2021-08-18 | Discharge: 2021-08-18 | Payer: MEDICARE

## 2021-09-06 ENCOUNTER — Encounter: Admit: 2021-09-06 | Discharge: 2021-09-06 | Payer: MEDICARE

## 2021-10-05 ENCOUNTER — Encounter: Admit: 2021-10-05 | Discharge: 2021-10-05 | Payer: MEDICARE

## 2021-10-05 MED ORDER — ERGOCALCIFEROL (VITAMIN D2) 1,250 MCG (50,000 UNIT) PO CAP
ORAL_CAPSULE | 0 refills
Start: 2021-10-05 — End: ?

## 2021-10-12 ENCOUNTER — Encounter: Admit: 2021-10-12 | Discharge: 2021-10-12 | Payer: MEDICARE

## 2021-10-12 DIAGNOSIS — I420 Dilated cardiomyopathy: Secondary | ICD-10-CM

## 2021-10-12 DIAGNOSIS — E785 Hyperlipidemia, unspecified: Secondary | ICD-10-CM

## 2021-10-12 DIAGNOSIS — G4733 Obstructive sleep apnea (adult) (pediatric): Secondary | ICD-10-CM

## 2021-11-08 ENCOUNTER — Encounter: Admit: 2021-11-08 | Discharge: 2021-11-08 | Payer: MEDICARE

## 2021-11-16 ENCOUNTER — Encounter: Admit: 2021-11-16 | Discharge: 2021-11-16 | Payer: MEDICARE

## 2021-11-17 ENCOUNTER — Encounter: Admit: 2021-11-17 | Discharge: 2021-11-17 | Payer: MEDICARE

## 2021-12-06 ENCOUNTER — Ambulatory Visit: Admit: 2021-12-06 | Discharge: 2021-12-06 | Payer: MEDICARE

## 2021-12-06 ENCOUNTER — Encounter: Admit: 2021-12-06 | Discharge: 2021-12-06 | Payer: MEDICARE

## 2021-12-06 DIAGNOSIS — Z006 Encounter for examination for normal comparison and control in clinical research program: Secondary | ICD-10-CM

## 2022-01-02 ENCOUNTER — Encounter: Admit: 2022-01-02 | Discharge: 2022-01-02 | Payer: MEDICARE

## 2022-01-02 ENCOUNTER — Ambulatory Visit: Admit: 2022-01-02 | Discharge: 2022-01-02 | Payer: MEDICARE

## 2022-01-02 ENCOUNTER — Ambulatory Visit: Admit: 2022-01-02 | Discharge: 2022-01-03 | Payer: MEDICARE

## 2022-01-02 DIAGNOSIS — E119 Type 2 diabetes mellitus without complications: Secondary | ICD-10-CM

## 2022-01-02 DIAGNOSIS — R1115 Cyclical vomiting syndrome unrelated to migraine: Secondary | ICD-10-CM

## 2022-01-02 DIAGNOSIS — M1990 Unspecified osteoarthritis, unspecified site: Secondary | ICD-10-CM

## 2022-01-02 DIAGNOSIS — I420 Dilated cardiomyopathy: Secondary | ICD-10-CM

## 2022-01-02 DIAGNOSIS — R1310 Dysphagia, unspecified: Secondary | ICD-10-CM

## 2022-01-02 DIAGNOSIS — G40409 Other generalized epilepsy and epileptic syndromes, not intractable, without status epilepticus: Secondary | ICD-10-CM

## 2022-01-02 DIAGNOSIS — R112 Nausea with vomiting, unspecified: Secondary | ICD-10-CM

## 2022-01-02 DIAGNOSIS — R140 Abdominal distension (gaseous): Secondary | ICD-10-CM

## 2022-01-02 DIAGNOSIS — K8689 Other specified diseases of pancreas: Secondary | ICD-10-CM

## 2022-01-02 DIAGNOSIS — E785 Hyperlipidemia, unspecified: Secondary | ICD-10-CM

## 2022-01-02 DIAGNOSIS — R0789 Other chest pain: Secondary | ICD-10-CM

## 2022-01-02 DIAGNOSIS — I509 Heart failure, unspecified: Secondary | ICD-10-CM

## 2022-01-02 DIAGNOSIS — M4802 Spinal stenosis, cervical region: Secondary | ICD-10-CM

## 2022-01-02 DIAGNOSIS — N189 Chronic kidney disease, unspecified: Secondary | ICD-10-CM

## 2022-01-02 DIAGNOSIS — G4733 Obstructive sleep apnea (adult) (pediatric): Secondary | ICD-10-CM

## 2022-01-02 DIAGNOSIS — E8729 Ketoacidosis: Secondary | ICD-10-CM

## 2022-01-02 DIAGNOSIS — Z9889 Other specified postprocedural states: Secondary | ICD-10-CM

## 2022-01-02 MED ORDER — PROMETHAZINE 25 MG PO TAB
25 mg | ORAL_TABLET | ORAL | 1 refills | 7.00000 days | Status: AC | PRN
Start: 2022-01-02 — End: ?

## 2022-01-02 NOTE — Telephone Encounter
PA needed for phnergan.     PA submitted via CMM.     Key: [id number]

## 2022-01-02 NOTE — Telephone Encounter
PA approved.

## 2022-01-03 ENCOUNTER — Encounter: Admit: 2022-01-03 | Discharge: 2022-01-03 | Payer: MEDICARE

## 2022-01-03 NOTE — Telephone Encounter
Pt requested his GET to be done in [place]. Called [facility] and confirmed that they do they 4-hour GET. Order signed by Waymon Budge with a note that a 4-hour test is needed. Faxed to [phone]

## 2022-01-09 ENCOUNTER — Encounter: Admit: 2022-01-09 | Discharge: 2022-01-09 | Payer: MEDICARE

## 2022-01-09 NOTE — Telephone Encounter
Received a VM from pt's wife, Margaretha Glassing, asking about his prescription for Phenergan and which OTC meds he should take.    Called [facility] to verify that they had received the Phenergan RX and confirmed that it was ready.    Called Margaretha Glassing to convey that information and to tell her about the OTC meds: CoQ10 200 mg BID, riboflavin 100 mg every day, L-carnitine 1 g BID. No further questions

## 2022-01-24 ENCOUNTER — Encounter: Admit: 2022-01-24 | Discharge: 2022-01-24 | Payer: MEDICARE

## 2022-02-02 ENCOUNTER — Encounter: Admit: 2022-02-02 | Discharge: 2022-02-02 | Payer: MEDICARE

## 2022-02-05 ENCOUNTER — Encounter: Admit: 2022-02-05 | Discharge: 2022-02-05 | Payer: MEDICARE

## 2022-02-07 ENCOUNTER — Encounter: Admit: 2022-02-07 | Discharge: 2022-02-07 | Payer: MEDICARE

## 2022-02-07 DIAGNOSIS — R112 Nausea with vomiting, unspecified: Secondary | ICD-10-CM

## 2022-02-09 ENCOUNTER — Encounter: Admit: 2022-02-09 | Discharge: 2022-02-09 | Payer: MEDICARE

## 2022-02-12 ENCOUNTER — Encounter: Admit: 2022-02-12 | Discharge: 2022-02-12 | Payer: MEDICARE

## 2022-02-12 ENCOUNTER — Ambulatory Visit: Admit: 2022-02-12 | Discharge: 2022-02-13 | Payer: MEDICARE

## 2022-02-12 DIAGNOSIS — I509 Heart failure, unspecified: Secondary | ICD-10-CM

## 2022-02-12 DIAGNOSIS — M1990 Unspecified osteoarthritis, unspecified site: Secondary | ICD-10-CM

## 2022-02-12 DIAGNOSIS — E785 Hyperlipidemia, unspecified: Secondary | ICD-10-CM

## 2022-02-12 DIAGNOSIS — E119 Type 2 diabetes mellitus without complications: Secondary | ICD-10-CM

## 2022-02-12 DIAGNOSIS — E8729 Ketoacidosis: Secondary | ICD-10-CM

## 2022-02-12 DIAGNOSIS — M4802 Spinal stenosis, cervical region: Secondary | ICD-10-CM

## 2022-02-12 DIAGNOSIS — G4733 Obstructive sleep apnea (adult) (pediatric): Secondary | ICD-10-CM

## 2022-02-12 DIAGNOSIS — G40409 Other generalized epilepsy and epileptic syndromes, not intractable, without status epilepticus: Secondary | ICD-10-CM

## 2022-02-12 DIAGNOSIS — R0789 Other chest pain: Secondary | ICD-10-CM

## 2022-02-12 DIAGNOSIS — Z9889 Other specified postprocedural states: Secondary | ICD-10-CM

## 2022-02-12 DIAGNOSIS — I420 Dilated cardiomyopathy: Secondary | ICD-10-CM

## 2022-02-12 MED ORDER — METFORMIN 500 MG PO TB24
2000 mg | ORAL_TABLET | Freq: Every day | ORAL | 3 refills | Status: AC
Start: 2022-02-12 — End: ?
  Filled 2022-02-12: qty 360, 90d supply, fill #1

## 2022-02-12 MED ORDER — OZEMPIC 0.25 MG OR 0.5 MG (2 MG/3 ML) SC PNIJ
.25 mg | SUBCUTANEOUS | 1 refills | Status: AC
Start: 2022-02-12 — End: ?

## 2022-02-12 NOTE — Progress Notes
Date of Service: 02/12/2022    Subjective:             Karl Knapp is a 71 y.o. male.    History of Present Illness  The presents to [facility] at [facility] for management of diabetes mellitus.     Type 2 Diabetes mellitus  Dx: 10 years ago    Father and Brother also have diabetes.  Brother used insulin     A1c today is 10.0.  Previous A1c was 9.0 in 08/2020  POC glucose is 258    Interval history:  Last seen with endocrinology at [facility] in [place]. Karl Knapp 11/14/2021, would like to try other things.  Is having difficulty with occasional GI symptoms including diarrhea and nausea/vomiting.  Has never tried Ozempic.    Current DM regimen: Levemir 42 units BID, Novolog to 14-16 units plus sliding scale TID/BID with meals Janumet XR 50/1000 mg 2 with evening meal, Farxiga 10 mg  Past treatment: none  Adherence to medications: patient reports compliance  FSBG frequency: twice daily monitoring, blood sugars range from 2 to 3.  Has Libre 2 CGM, uses twice daily - not with him today  Hyperglycemia: yes  Hypoglycemia: yes - down to 60  Hypoglycemia unawareness?: no  Meals per day / Carb intake: 2  Exercise: no - cardiomyopathy  Dyspnea/Chest pain with exertion: no  Last DM education / nutritionist visit: none    Complications of DM:  CAD: yes  CVA: No  PVD: No  Amputations: No  Retinopathy: No  -- Last Dilated Eye exam 2 years ago  Gastropathy: No  Nephropathy: yes - just started with nephorologist  Neuropathy: yes  Depression: No  Chronic wounds / delayed healing: No  DM related hospitalizations: No      Medical History:   Diagnosis Date    Arthritis 02/22/2009    Atypical chest pain 01/26/2009    Cardiomyopathy     Cervical stenosis of spine     CHF (congestive heart failure) (HCC)     Diabetes mellitus type 2, insulin dependent (HCC) 02/22/2009    History of back surgery 02/22/2009    Hyperlipemia     Ketoacidosis     Myoclonic epilepsy (HCC)     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   Respiratory:  Negative for shortness of breath.    Cardiovascular:  Negative for chest pain, palpitations and leg swelling.     Objective:          acyclovir (ZOVIRAX) 400 mg tablet Take one tablet by mouth every 24 hours.    amLODIPine (NORVASC) 10 mg tablet Take one tablet by mouth daily.    atorvastatin (LIPITOR) 40 mg tablet Take one tablet by mouth at bedtime daily.    baclofen (LIORESAL) 10 mg tablet Take one tablet by mouth twice daily.    carvedilol (COREG) 25 mg tablet  Take one tablet by mouth twice daily with meals. Take with food.    celecoxib (CELEBREX) 200 mg capsule Take one capsule by mouth daily.    cholecalciferol (VITAMIN D-3) 1,000 units tablet Take one tablet by mouth daily.    dapagliflozin (FARXIGA) 10 mg tablet Take one tablet by mouth daily.    divalproex (DEPAKOTE ER) 500 mg ER tablet Take one tablet by mouth three times daily. Take with food.    ergocalciferol (vitamin D2) (VITAMIN D2) 1,250 mcg (50,000 unit) capsule Take one capsule by mouth every 7 days.    escitalopram oxalate (LEXAPRO) 20 mg tablet Take one tablet by mouth twice daily.    gabapentin (NEURONTIN) 300 mg capsule Take two capsules by mouth twice daily.    HYDROcodone/acetaminophen(+) (NORCO) 10/325 mg tablet Take one tablet by mouth at bedtime daily.    insulin detemir(+) (LEVEMIR FLEXTOUCH U-100 INSULN) 100 unit/mL (3 mL) injection pen Inject six Units to eleven Units under the skin twice daily.    insulin lispro(+) (HUMALOG KWIKPEN INSULIN) 100 unit/mL injection PEN Inject forty two Units under the skin twice daily with meals.    lisinopril (PRINIVIL; ZESTRIL) 20 mg tablet Take 1 Tab by mouth daily.    LORazepam (ATIVAN) 1 mg tablet Take 1 mg when nausea begins    naloxone (NARCAN) 4 mg/actuation nasal spray Insert one spray into nose as directed as Needed.    ondansetron (ZOFRAN ODT) 4 mg rapid dissolve tablet Dissolve  by mouth as Needed.    oxyCODONE (ROXICODONE) 30 mg tablet Take one tablet by mouth as Needed.    pantoprazole DR (PROTONIX) 40 mg tablet Take one tablet by mouth daily.    promazine HCl (PROMAZINE PO) Take 25 mg by mouth.    promethazine (PHENERGAN) 25 mg tablet Take one tablet by mouth every 6 hours as needed for Nausea or Vomiting.    SITagliptin phosphate (JANUVIA) 100 mg tablet Take one tablet by mouth daily.     There were no vitals filed for this visit.   There is no height or weight on file to calculate BMI.   BP Readings from Last 4 Encounters:   01/02/22 (!) 119/100   07/06/21 (!) 160/67   10/06/20 122/62   10/06/20 120/68      Wt Readings from Last 4 Encounters:   01/02/22 83.9 kg (185 lb)   07/06/21 84.8 kg (187 lb)   10/06/20 88 kg (194 lb 0.1 oz)   10/06/20 87 kg (191 lb 12.8 oz)      Comprehensive Metabolic Profile    Lab Results   Component Value Date/Time    NA 135 (L) 10/04/2020 12:00 AM    K 5.5 (H) 10/04/2020 12:00 AM    CL 104 10/04/2020 12:00 AM    CO2 24.0 10/04/2020 12:00 AM    GAP 13 10/04/2020 12:00 AM    BUN 29.0 (H) 10/04/2020 12:00 AM    CR 1.44 (H) 10/04/2020 12:00 AM    GLU 263 (H) 10/04/2020 12:00 AM    Lab Results   Component Value Date/Time    CA 9.4 10/04/2020 12:00 AM    PO4 3.8 11/16/2013 08:00 AM    ALBUMIN 4.1 08/09/2020 12:00 AM    TOTPROT 7.0 08/09/2020 12:00 AM    ALKPHOS 69 08/09/2020 12:00 AM    AST 13 08/09/2020 12:00 AM    ALT 11 08/09/2020 12:00 AM    TOTBILI 0.66 08/09/2020 12:00 AM    GFR 51.7 (L) 10/04/2020 12:00 AM  GFRAA >60 09/10/2019 03:38 PM        No results found for: MCALB24 No results found for: URMALBCRRAT  No results found for: [facility]    Lab Results   Component Value Date    CHOL 131 10/04/2020    TRIG 140 10/04/2020    HDL 39 (L) 10/04/2020    LDL 64 10/04/2020    VLDL 28 10/04/2020    CHOLHDLC 3 10/04/2020        TSH   Date Value Ref Range Status   08/09/2020 3.28  Final       Hemoglobin A1C   Date Value Ref Range Status   08/09/2020 9.0 (H) <5.7 Final   08/18/2019 8.1 (H) <5.7 Final   01/16/2017 9.3 (H) 4.5 - 6.5 Final     No results found for: A1C      Lab Results   Component Value Date    PLTCT 191 06/14/2020        Screening for Fatty liver disease:   FIB 4 Score: Computed FIB-4 Calculation unavailable. Necessary lab results were not found in the last year.     Physical Exam  Diabetic Foot Exam       Bilateral vascular, sensation, integument are normal:  No    Vascular Status    Left:normal Right:normal   Monofilament Testing    Left:Diminished Right:Diminished   Sensation    Left:pinprick sensation diminished, Right:pinprick sensation diminished,   Skin Integrity    Left:Normal Right:Normal   Foot Structure    Left:Normal Right:Normal           Assessment and Plan:  Diabetes mellitus type 2,  uncontrolled   A1c 10.0  in clinic today - Not At Goal   Target A1c <7% without significant or frequent hypoglycemia  Currently on Levemir 42 units BID, Novolog to 14-16 units plus sliding scale TID/BID with meals Janumet XR 50/1000 mg 2 with evening meal, Farxiga 10 mg   DM Complications: CAD  Assessment of glycemic control: blood sugars are consistently high    Plan:  Start Ozempic 0.25 mg once weekly for 4 weeks, then increase to 0.5 mg once weekly  Decrease levemir to 35 units BID when you start Ozempic.  Continue humalog at 14-20 units with meals  Continue Farxiga 10 mg once daily  Stop Janumet, replace with metformin HCL ER 2000 mg daily.  Patient to resume use of FreeStyle Libre - scan 4 times per day  Discussed rule of 15 and how to tx hypoglycemia  Reminded pt to rotate injection sites  Reminded pt to have medical alert bracelet  Referred to meet with diabetes educator    Diabetic Co-morbidities - prevention and management:  - Annual labs (electrolytes and renal function): 10/2021  - Biannual dental visit:  2023  - Retinopathy - Annual eye exam: due now.   - Hypertension: yes . Goal BP <130/80.  - Dyslipidemia - yes statin - Atorvastatin 40 mg. Last lipid profile: 2022  - Nephropathy: yes. Annual Urine microalbumin/Cr: due now.    ACE/ARB?: Lisinopril 20 mg daily  - Neuropathy: yes .  Monofilament exam (annual): done today. Discussed foot care. Need DM shoes: no   - Diabetic Educator and/or nutritionist visit (annually): referred today    BMI 25.63  Discussed patient's BMI with him.  The body mass index is 25.63 kg/m?Marland Kitchen and falls within the category of Overweight (25 to <30); BMI plan is in progress.      Plan of care discussed  with patient and patient is agreeable.      Total Time Today was 60 minutes in the following activities: Preparing to see the patient, Performing a medically appropriate examination and/or evaluation, Counseling and educating the patient/family/caregiver, Ordering medications, tests, or procedures, and Documenting clinical information in the electronic or other health record    RTC 3 months                       Encounter Medications   Medications    semaglutide (OZEMPIC) 0.25 mg or 0.5 mg (2 mg/3 mL) injection PEN     Sig: Inject one-quarter mg under the skin every 7 days. Inject 0.25 mg once weekly x 4 weeks, then increase to 0.5 mg once weekly.  Indications: type 2 diabetes mellitus     Dispense:  9 mL     Refill:  1     Order Specific Question:   Collaborating / Supervising Provider     Answer:   Glyn Ade [id number]    metFORMIN-XR (GLUCOPHAGE XR) 500 mg extended release tablet     Sig: Take four tablets by mouth daily with dinner. Indications: type 2 diabetes mellitus     Dispense:  360 tablet     Refill:  3     Order Specific Question: Collaborating / Supervising Provider     Answer:   Glyn Ade [[id number]]      Patient Instructions   Thank you for meeting with me today.  It was nice to get to know you!    Plans we discussed during our visit:    1)  Recommend you scan your glucose level 4 times per day with CGM  2) I have referred you to see a diabetes educator  3) We discussed adding Ozempic - 0.25 mg once weekly for 4 weeks, then increase to 0.5 mg once weekly.  This medication is to be taken on the same day every week.  4) Decrease levemir to 35 units twice daily when you start Ozempic.  Continue Humalog 14-20 units with meals.  5) Continue Farxiga 10 mg once daily  6) Stop Janumet, replace with metformin  7) I have ordered a C-peptide and metabolic panel, this requires a 6 hour fast.  Try to have your blood sugar under 200 at the time of the blood draw.  8) Delete the FreeStyle Libre 14 app from your phone.  9) Add Libre 2 app to your phone.  10) Have an eye exam when you are able.    Please message me in My Chart or contact my nurse Durenda Age at [phone] with any questions.    [employer], APRN-NP

## 2022-02-13 ENCOUNTER — Encounter: Admit: 2022-02-13 | Discharge: 2022-02-13 | Payer: MEDICARE

## 2022-02-13 DIAGNOSIS — Z794 Long term (current) use of insulin: Secondary | ICD-10-CM

## 2022-02-13 DIAGNOSIS — E119 Type 2 diabetes mellitus without complications: Secondary | ICD-10-CM

## 2022-02-13 NOTE — Progress Notes
Pharmacy Benefits Investigation    Medication name: OZEMPIC 0.25 MG OR 0.5 MG (2 MG/3 ML) SC PNIJ    The insurance does not require a prior authorization for the medication.    The out of pocket cost is $94.    Copay assistance is not available. All available forms of copay assistance were evaluated and none are currently available for [employer].    Contacted Burlie Telford Nab to discuss the approval and copay. Left voicemail asking patient to return call to the specialty pharmacy billing team at [phone]. Will follow up in 2 business days if patient has not returned call. Presciption will be placed on hold until patient calls back to request fill.    Arman Filter  Specialty Pharmacy Patient Advocate

## 2022-02-15 ENCOUNTER — Encounter: Admit: 2022-02-15 | Discharge: 2022-02-15 | Payer: MEDICARE

## 2022-02-16 ENCOUNTER — Encounter: Admit: 2022-02-16 | Discharge: 2022-02-16 | Payer: MEDICARE

## 2022-02-16 DIAGNOSIS — E119 Type 2 diabetes mellitus without complications: Secondary | ICD-10-CM

## 2022-02-19 ENCOUNTER — Encounter: Admit: 2022-02-19 | Discharge: 2022-02-19 | Payer: MEDICARE

## 2022-02-19 NOTE — Progress Notes
Medication Assistance Program packet has been sent to patient for signatures and other required documents (if applicable) . Once received back, medication assistance coordinator will begin processing     Titianna Loomis  Medication Assistance Coordinator   [phone]

## 2022-02-22 ENCOUNTER — Encounter: Admit: 2022-02-22 | Discharge: 2022-02-22 | Payer: MEDICARE

## 2022-02-22 DIAGNOSIS — M1990 Unspecified osteoarthritis, unspecified site: Secondary | ICD-10-CM

## 2022-02-22 DIAGNOSIS — I2510 Atherosclerotic heart disease of native coronary artery without angina pectoris: Secondary | ICD-10-CM

## 2022-02-22 DIAGNOSIS — E785 Hyperlipidemia, unspecified: Secondary | ICD-10-CM

## 2022-02-22 DIAGNOSIS — M4802 Spinal stenosis, cervical region: Secondary | ICD-10-CM

## 2022-02-22 DIAGNOSIS — G4733 Obstructive sleep apnea (adult) (pediatric): Secondary | ICD-10-CM

## 2022-02-22 DIAGNOSIS — I420 Dilated cardiomyopathy: Secondary | ICD-10-CM

## 2022-02-22 DIAGNOSIS — Z9889 Other specified postprocedural states: Secondary | ICD-10-CM

## 2022-02-22 DIAGNOSIS — E8729 Ketoacidosis: Secondary | ICD-10-CM

## 2022-02-22 DIAGNOSIS — G40409 Other generalized epilepsy and epileptic syndromes, not intractable, without status epilepticus: Secondary | ICD-10-CM

## 2022-02-22 DIAGNOSIS — E119 Type 2 diabetes mellitus without complications: Secondary | ICD-10-CM

## 2022-02-22 DIAGNOSIS — I509 Heart failure, unspecified: Secondary | ICD-10-CM

## 2022-02-22 DIAGNOSIS — R0789 Other chest pain: Secondary | ICD-10-CM

## 2022-02-22 MED ORDER — ESCITALOPRAM OXALATE 20 MG PO TAB
20 mg | ORAL_TABLET | Freq: Every day | ORAL | 0 refills | Status: AC
Start: 2022-02-22 — End: ?

## 2022-02-22 NOTE — Patient Instructions
Echocardiogram in new few weeks.  Stress test next Fall.  Lexapro at pharmacy

## 2022-02-22 NOTE — Assessment & Plan Note
He had a non-ischemic stress test and echocardiogram in 10/2020.  His EF was around 45% and I've ordered a follow-up echo to re-assess EF.  No angina symptoms.

## 2022-02-22 NOTE — Assessment & Plan Note
Lab Results   Component Value Date    CHOL 129 11/13/2021    TRIG 157 (H) 11/13/2021    HDL 34 (L) 11/13/2021    LDL 64 11/13/2021    VLDL 31 11/13/2021    CHOLHDLC 4 11/13/2021      LDL looks great on current medication.

## 2022-03-08 ENCOUNTER — Encounter: Admit: 2022-03-08 | Discharge: 2022-03-08 | Payer: MEDICARE

## 2022-03-08 ENCOUNTER — Ambulatory Visit: Admit: 2022-03-08 | Discharge: 2022-03-08 | Payer: MEDICARE

## 2022-03-08 DIAGNOSIS — I420 Dilated cardiomyopathy: Secondary | ICD-10-CM

## 2022-04-10 ENCOUNTER — Ambulatory Visit: Admit: 2022-04-10 | Discharge: 2022-04-10 | Payer: MEDICARE

## 2022-04-10 ENCOUNTER — Encounter: Admit: 2022-04-10 | Discharge: 2022-04-10 | Payer: MEDICARE

## 2022-04-10 ENCOUNTER — Ambulatory Visit: Admit: 2022-04-10 | Discharge: 2022-04-11 | Payer: MEDICARE

## 2022-04-10 DIAGNOSIS — N183 Stage 3 chronic kidney disease, unspecified whether stage 3a or 3b CKD (HCC): Secondary | ICD-10-CM

## 2022-04-10 DIAGNOSIS — M4802 Spinal stenosis, cervical region: Secondary | ICD-10-CM

## 2022-04-10 DIAGNOSIS — E119 Type 2 diabetes mellitus without complications: Secondary | ICD-10-CM

## 2022-04-10 DIAGNOSIS — G40409 Other generalized epilepsy and epileptic syndromes, not intractable, without status epilepticus: Secondary | ICD-10-CM

## 2022-04-10 DIAGNOSIS — I509 Heart failure, unspecified: Secondary | ICD-10-CM

## 2022-04-10 DIAGNOSIS — M1990 Unspecified osteoarthritis, unspecified site: Secondary | ICD-10-CM

## 2022-04-10 DIAGNOSIS — R0789 Other chest pain: Secondary | ICD-10-CM

## 2022-04-10 DIAGNOSIS — I420 Dilated cardiomyopathy: Secondary | ICD-10-CM

## 2022-04-10 DIAGNOSIS — Z9889 Other specified postprocedural states: Secondary | ICD-10-CM

## 2022-04-10 DIAGNOSIS — E8729 Ketoacidosis: Secondary | ICD-10-CM

## 2022-04-10 DIAGNOSIS — G4733 Obstructive sleep apnea (adult) (pediatric): Secondary | ICD-10-CM

## 2022-04-10 DIAGNOSIS — E785 Hyperlipidemia, unspecified: Secondary | ICD-10-CM

## 2022-04-10 LAB — PHOSPHORUS: PHOSPHORUS: 3.7 mg/dL (ref 2.0–4.5)

## 2022-04-10 LAB — URINALYSIS DIPSTICK
LEUKOCYTES: NEGATIVE
NITRITE: NEGATIVE
URINE ASCORBIC ACID, UA: NEGATIVE
URINE BILE: NEGATIVE
URINE BLOOD: NEGATIVE
URINE KETONE: NEGATIVE

## 2022-04-10 LAB — BASIC METABOLIC PANEL
ANION GAP: 6 (ref 3–12)
BLD UREA NITROGEN: 38 mg/dL — ABNORMAL HIGH (ref 7–25)
CALCIUM: 8.6 mg/dL (ref 8.5–10.6)
CHLORIDE: 100 MMOL/L (ref 98–110)
CO2: 28 MMOL/L (ref 21–30)
CREATININE: 1.4 mg/dL — ABNORMAL HIGH (ref 0.4–1.24)
EGFR: 50 mL/min — ABNORMAL LOW (ref >60)
GLUCOSE,PANEL: 400 mg/dL — ABNORMAL HIGH (ref 70–100)
POTASSIUM: 5.4 MMOL/L — ABNORMAL HIGH (ref 3.5–5.1)
SODIUM: 134 MMOL/L — ABNORMAL LOW (ref 137–147)

## 2022-04-10 LAB — HEMOGLOBIN & HEMATOCRIT
HEMATOCRIT: 39 % — ABNORMAL LOW (ref 40–50)
HEMOGLOBIN: 13 g/dL (ref 13.5–16.5)

## 2022-04-10 LAB — PROTEIN/CR RATIO,UR RAN
PROT CREAT RAT/CAL: 0.2 — ABNORMAL HIGH (ref <0.15)
UR CREATININE, RAN: 34 mg/dL (ref 5.0–8.0)
UR TOTAL PROTEIN,RAN: 8 mg/dL (ref 1.005–1.030)

## 2022-04-10 LAB — 25-OH VITAMIN D (D2 + D3): VITAMIN D (25-OH) TOTAL: 17 ng/mL — ABNORMAL LOW (ref 30–80)

## 2022-04-10 LAB — PARATHYROID HORMONE: PTH HORMONE: 91 pg/mL — ABNORMAL HIGH (ref 10–65)

## 2022-04-10 NOTE — Patient Instructions
Please do not hesitate to call with any questions.  My nurse Jarrett Soho can be reached [phone].     Please go to the lab today on your way out     Return to clinic in 6 months Jeana and 12 months Celsey Asselin

## 2022-04-11 DIAGNOSIS — N189 Chronic kidney disease, unspecified: Secondary | ICD-10-CM

## 2022-04-16 ENCOUNTER — Encounter: Admit: 2022-04-16 | Discharge: 2022-04-16 | Payer: MEDICARE

## 2022-04-16 NOTE — Telephone Encounter
Attempted to contact pt, LVM to return call.

## 2022-04-16 NOTE — Telephone Encounter
-----   Message from Idolina Primer, MD sent at 04/11/2022 11:32 AM CST -----  Please let him know his Cr is back down to his baseline 1.5  If possible to decrease or stop celebrex please try.  He remain vitamin D def to increase cholecalciferol to 2,000 units a day   Thanks  [employer]

## 2022-04-20 ENCOUNTER — Encounter: Admit: 2022-04-20 | Discharge: 2022-04-20 | Payer: MEDICARE

## 2022-05-03 ENCOUNTER — Encounter: Admit: 2022-05-03 | Discharge: 2022-05-03 | Payer: MEDICARE

## 2022-05-03 ENCOUNTER — Ambulatory Visit: Admit: 2022-05-03 | Discharge: 2022-05-04 | Payer: MEDICARE

## 2022-05-04 DIAGNOSIS — Z794 Long term (current) use of insulin: Secondary | ICD-10-CM

## 2022-05-04 DIAGNOSIS — E119 Type 2 diabetes mellitus without complications: Secondary | ICD-10-CM

## 2022-05-13 ENCOUNTER — Encounter: Admit: 2022-05-13 | Discharge: 2022-05-13 | Payer: MEDICARE

## 2022-05-13 MED ORDER — ESCITALOPRAM OXALATE 20 MG PO TAB
20 mg | ORAL_TABLET | Freq: Every day | ORAL | 0 refills
Start: 2022-05-13 — End: ?

## 2022-05-23 ENCOUNTER — Encounter: Admit: 2022-05-23 | Discharge: 2022-05-23 | Payer: MEDICARE

## 2022-05-24 ENCOUNTER — Encounter: Admit: 2022-05-24 | Discharge: 2022-05-24 | Payer: MEDICARE

## 2022-05-24 ENCOUNTER — Ambulatory Visit: Admit: 2022-05-24 | Discharge: 2022-05-25 | Payer: MEDICARE

## 2022-05-24 DIAGNOSIS — G40409 Other generalized epilepsy and epileptic syndromes, not intractable, without status epilepticus: Secondary | ICD-10-CM

## 2022-05-24 DIAGNOSIS — E785 Hyperlipidemia, unspecified: Secondary | ICD-10-CM

## 2022-05-24 DIAGNOSIS — M1990 Unspecified osteoarthritis, unspecified site: Secondary | ICD-10-CM

## 2022-05-24 DIAGNOSIS — I509 Heart failure, unspecified: Secondary | ICD-10-CM

## 2022-05-24 DIAGNOSIS — E119 Type 2 diabetes mellitus without complications: Secondary | ICD-10-CM

## 2022-05-24 DIAGNOSIS — I420 Dilated cardiomyopathy: Secondary | ICD-10-CM

## 2022-05-24 DIAGNOSIS — E8729 Ketoacidosis: Secondary | ICD-10-CM

## 2022-05-24 DIAGNOSIS — M4802 Spinal stenosis, cervical region: Secondary | ICD-10-CM

## 2022-05-24 DIAGNOSIS — R0789 Other chest pain: Secondary | ICD-10-CM

## 2022-05-24 DIAGNOSIS — G4733 Obstructive sleep apnea (adult) (pediatric): Secondary | ICD-10-CM

## 2022-05-24 DIAGNOSIS — Z9889 Other specified postprocedural states: Secondary | ICD-10-CM

## 2022-05-24 IMAGING — CR [ID]
3 series · 3 of 3 positions shown · non-contrast
Comparison: none

[abdomen kub supine (1 of 3)]
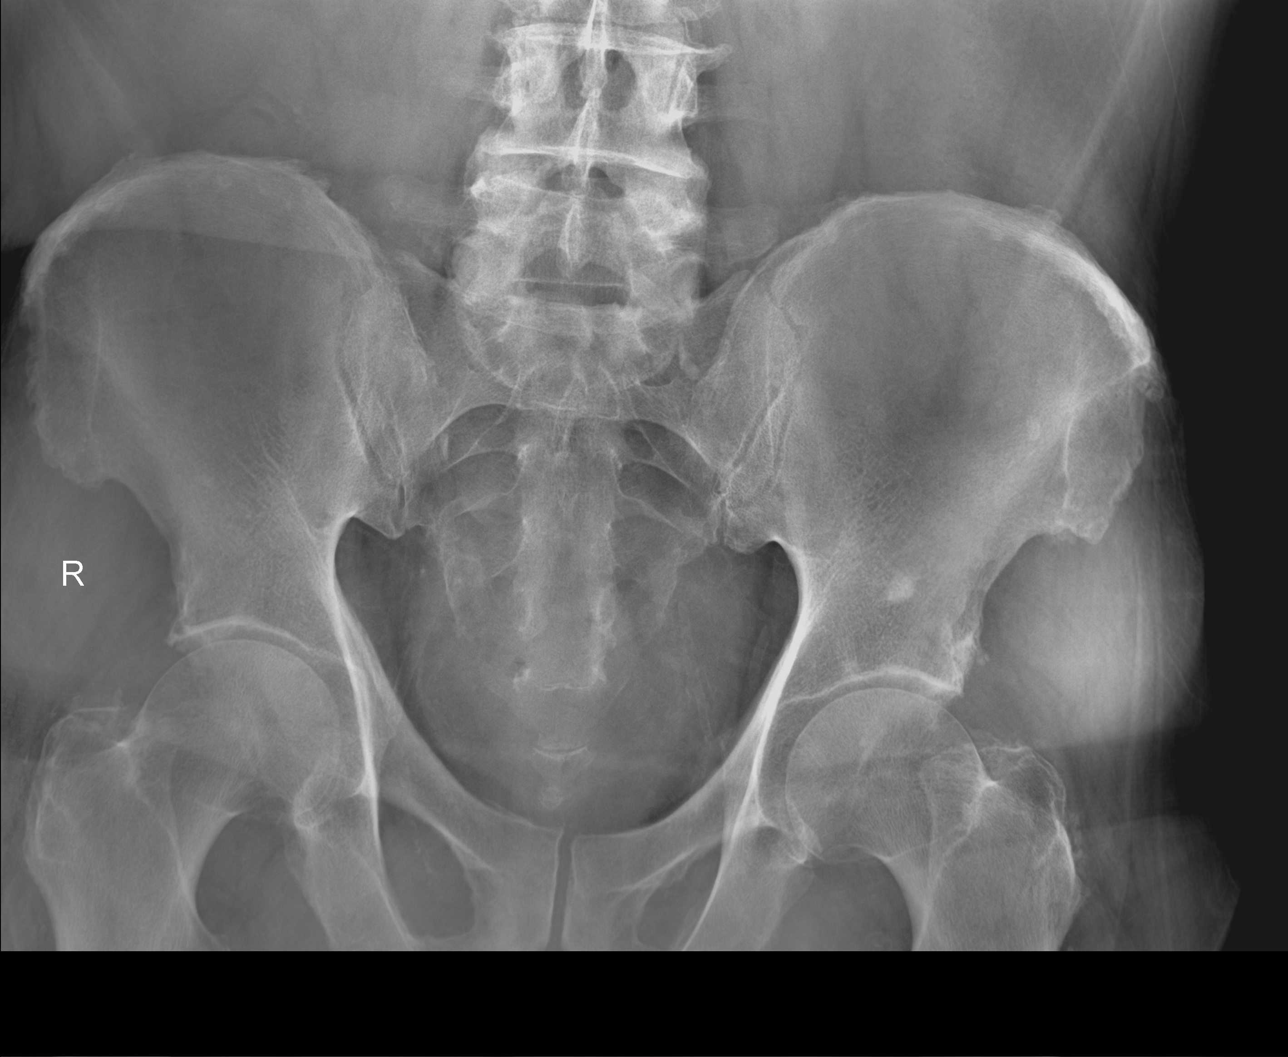

[abdomen kub supine (2 of 3)]
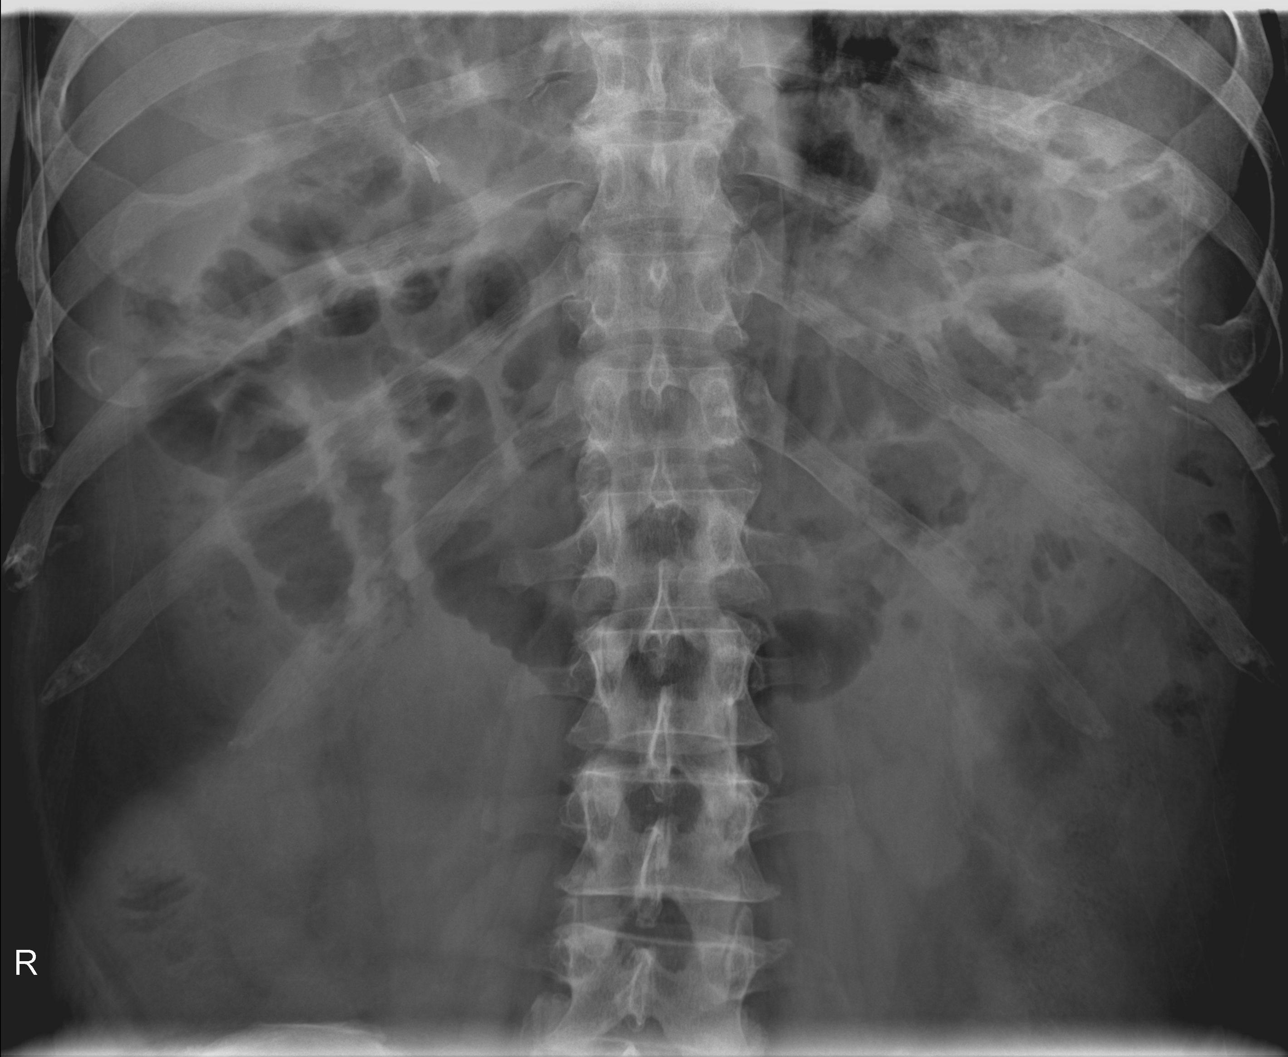

[abdomen kub supine (3 of 3)]
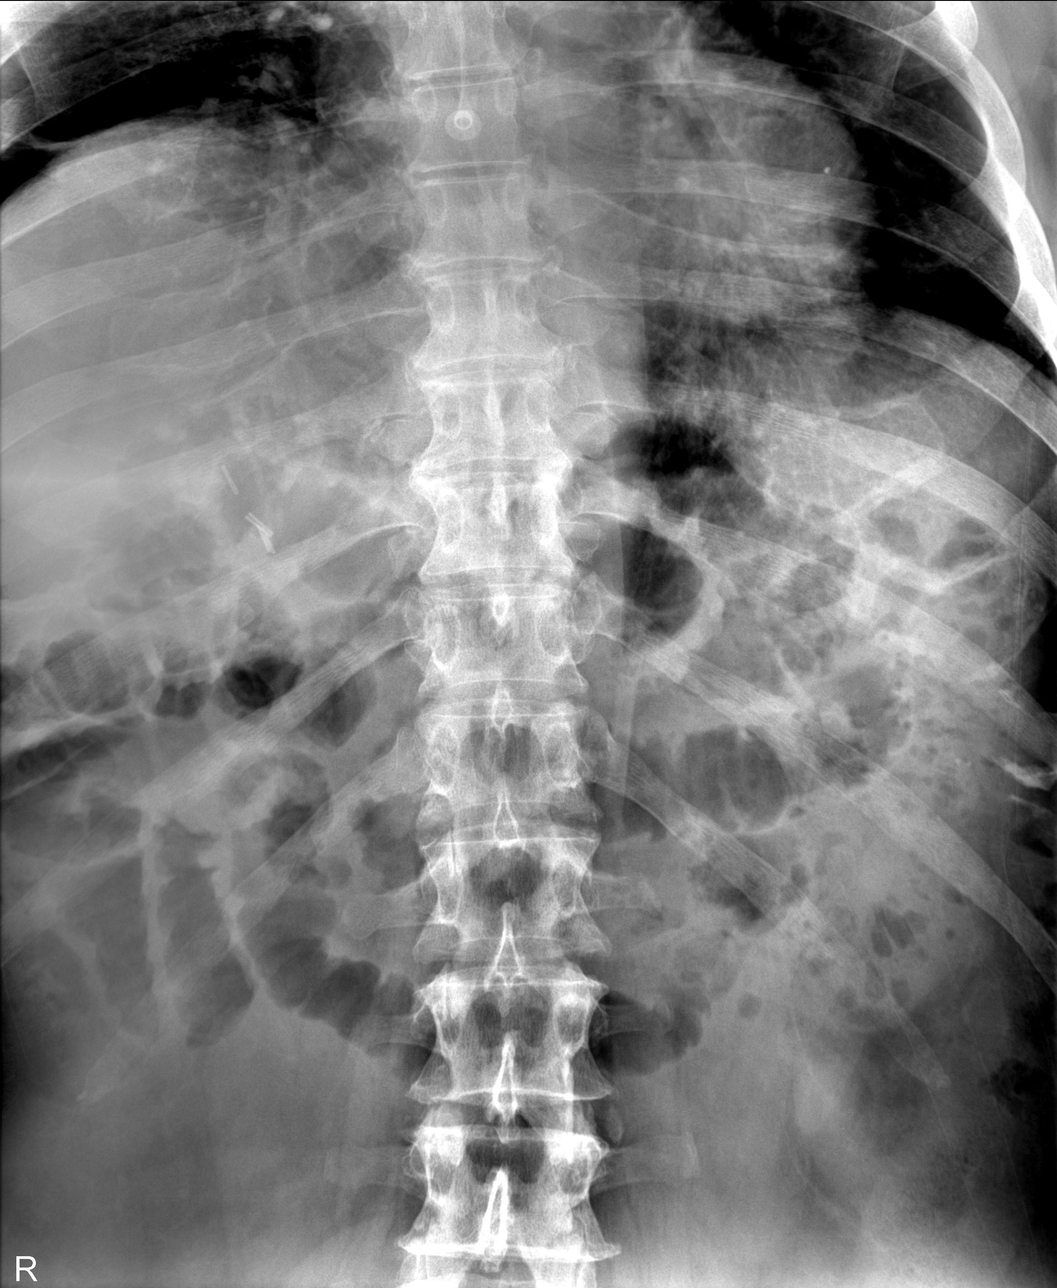

[3 of 3 positions shown; findings below may reference images not displayed]

DIAGNOSTIC STUDIES

EXAM

XR kub

INDICATION

abdpain
abd pain, nausea , vomiting , diarrhea

TECHNIQUE

AP view of the abdomen

COMPARISONS

None

FINDINGS

There are multiple air-filled, nondilated small bowel loops. Some of the bowel loops may have
thickened walls. There is a small amount of air identified in the colon. No gross free
intraperitoneal air is identified. There are a couple sclerotic densities involving the left iliac
wing which may represent bone islands

IMPRESSION

There are multiple air-filled, nondilated small bowel loops in the mid abdomen. A few of the small
bowel loops may have thickened walls and the findings are suspicious for an inflammatory or
infectious enteritis. Ischemic small bowel is within the differential in the proper clinical
setting. Follow-up CT of the abdomen and pelvis is recommended.

Tech Notes:

abd pain, nausea , vomiting , diarrhea

## 2022-05-24 IMAGING — CT ABDOMEN_PELVIS W(Adult)
2 of 6 series · 13 of 46 positions shown, 15 images · non-contrast
Comparison: none

[Series 2: abdomen_pelvis ax 3.00 br40 s3 · axial · 0.66mm/px · z∈[+886,+1342]mm · 10 of 186 slices shown, 12 images]
[im 17/186  soft-tissue]
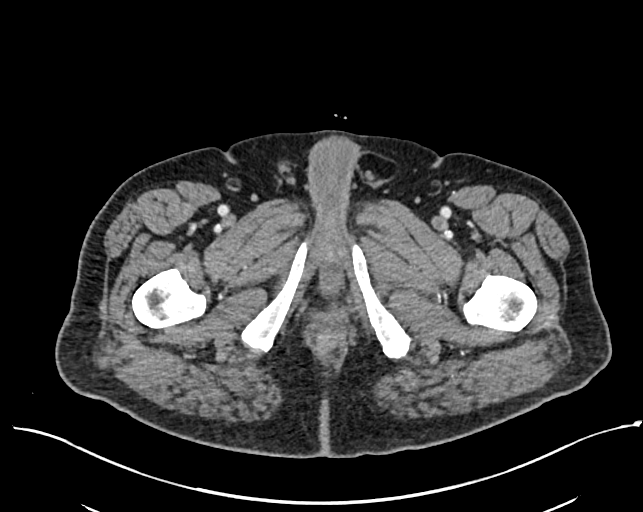
[im 17/186  bone]
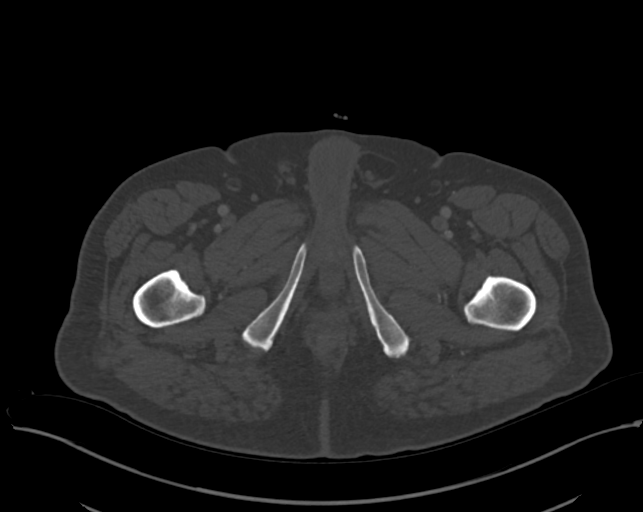
[im 34/186  soft-tissue]
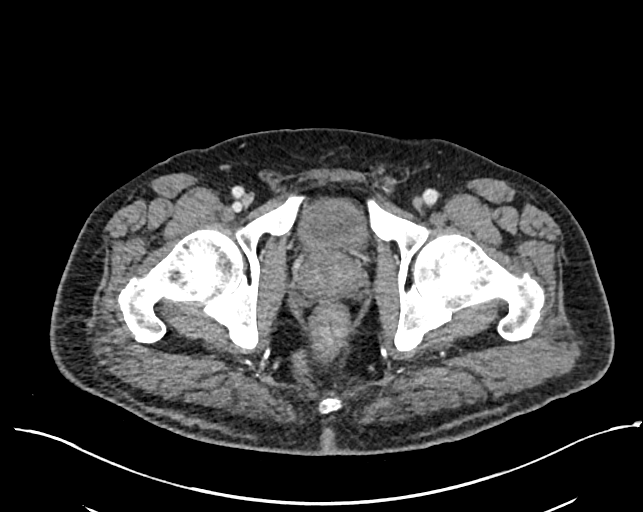
[im 51/186  soft-tissue]
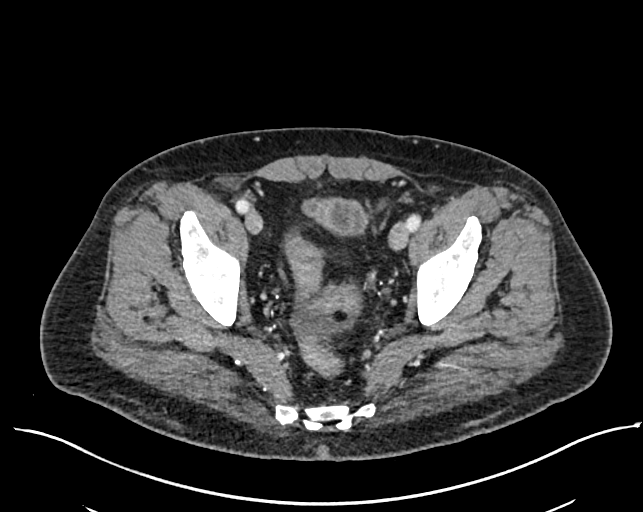
[im 68/186  soft-tissue]
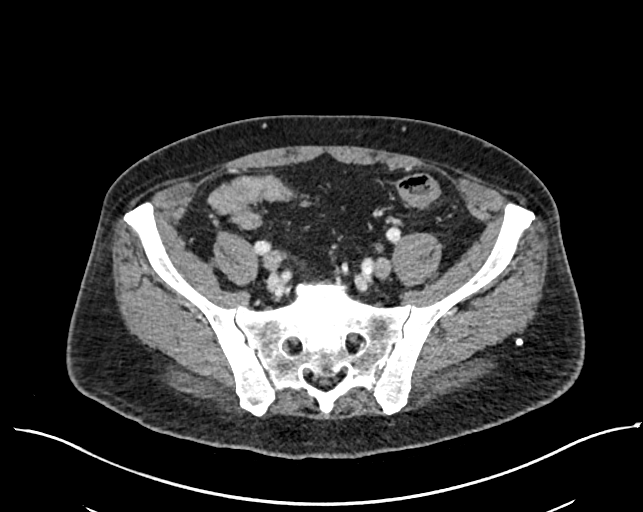
[im 85/186  soft-tissue]
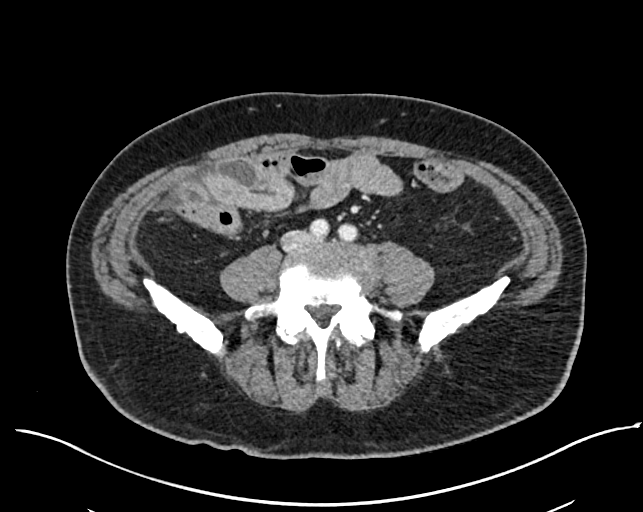
[im 101/186  soft-tissue]
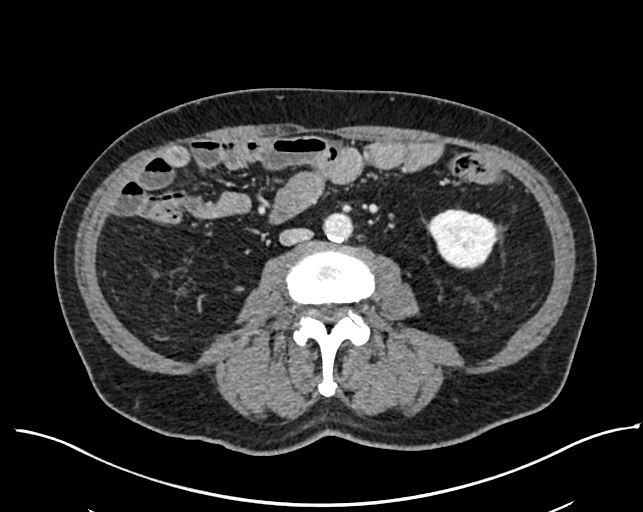
[im 118/186  soft-tissue]
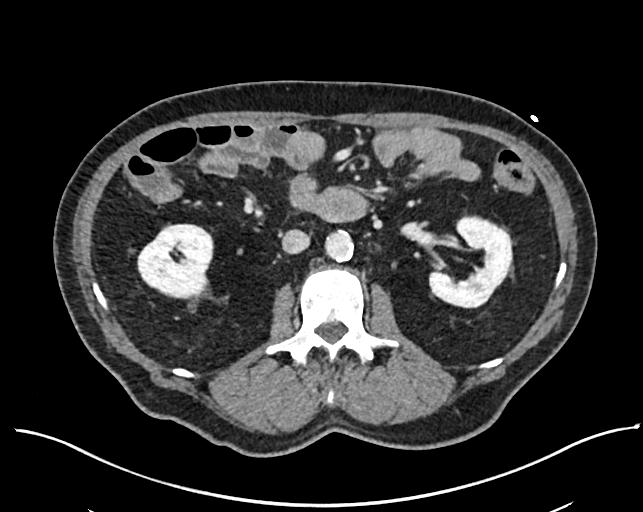
[im 135/186  soft-tissue]
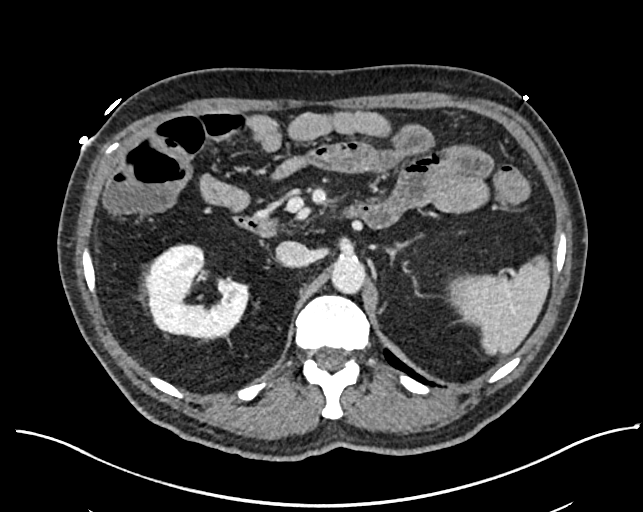
[im 152/186  soft-tissue]
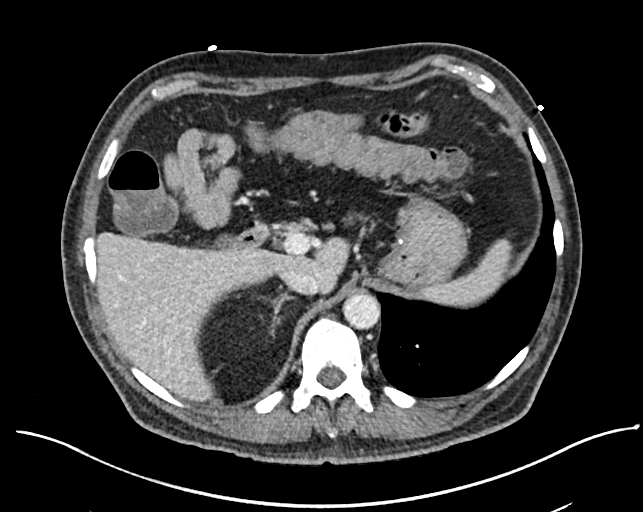
[im 152/186  bone]
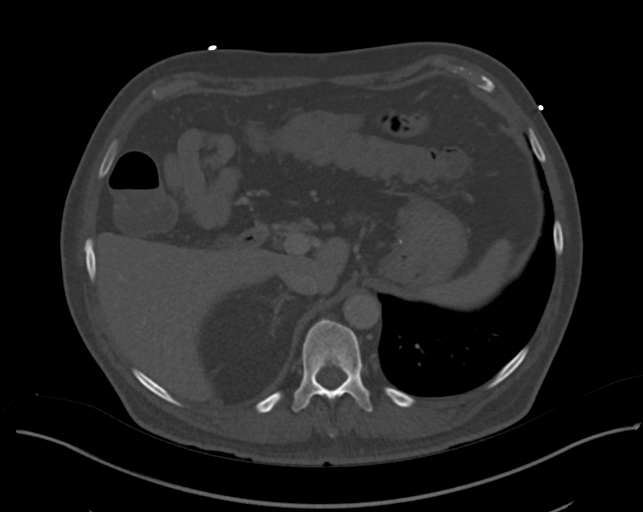
[im 169/186  soft-tissue]
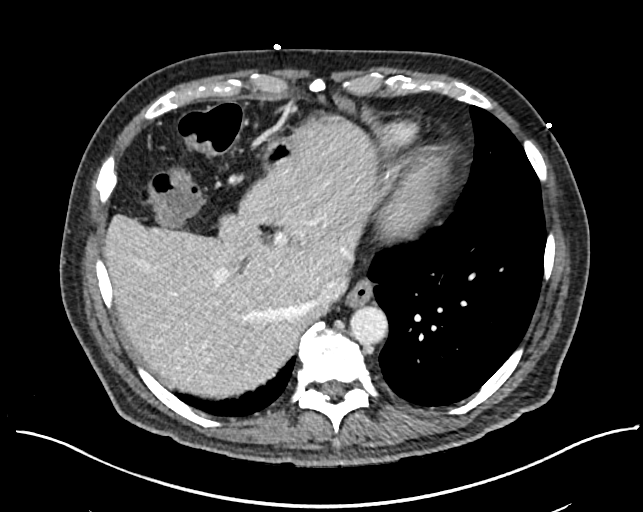

[Series 10: abdomen_pelvis cor 3.00 br40 s3 · coronal · 0.65mm/px · 3 of 139 slices shown]
[im 28/139  soft-tissue]
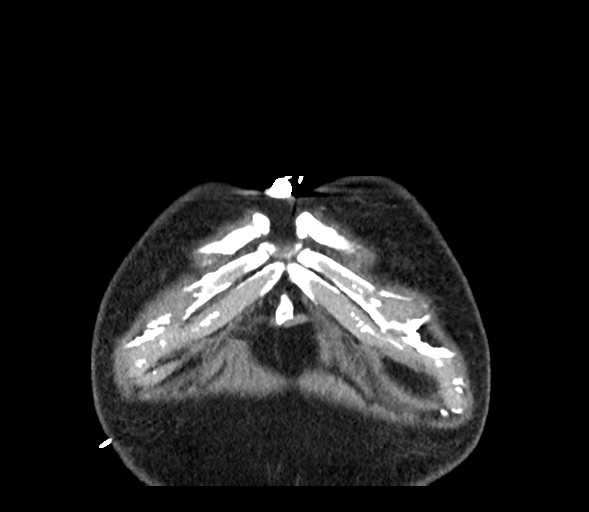
[im 56/139  soft-tissue]
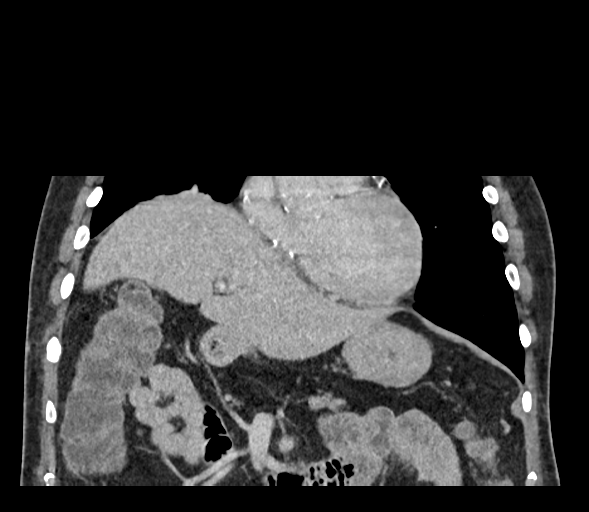
[im 83/139  soft-tissue]
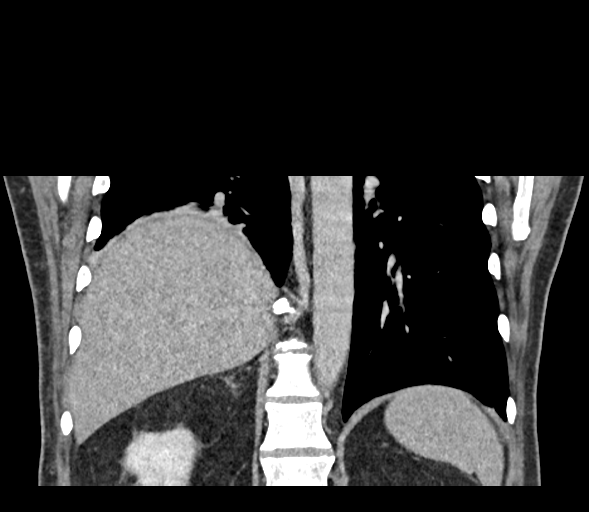

[13 of 46 positions shown; findings below may reference images not displayed]

DIAGNOSTIC STUDIES

EXAM

CT Abdomen/Pelvis w/Contrast

INDICATION

lower abd pain
c/o lower abd pain , N/V/D/ diagnosed with enteritis or gastroenteritis. onset 12 hours ago .

TECHNIQUE

CT of abdomen and pelvis was performed with contrast.

All CT scans at this facility use dose modulation, iterative reconstruction, and/or weight based
dosing when appropriate to reduce radiation dose to as low as reasonably achievable.

Number of previous computed tomography exams in the last 12 months is 0  .

Number of previous nuclear medicine myocardial perfusion studies in the last 12 months is 0  .

COMPARISONS

Is CT on April 19, 2017

FINDINGS

Visualized lower thorax: There is elevation of the right hemidiaphragm. There is minimal bilateral
basilar atelectasis.

Liver: Normal size and attenuation.

Spleen: Normal size and attenuation.

Gallbladder and biliary system: The gallbladder is been surgically removed

Pancreas: Normal.

Adrenals: Normal.

Kidneys: There is no significant hydronephrosis or renal stones. There is the probable cyst
involving the upper pole the right kidney. There is no significant ureteral dilatation.

GI tract: The colon is unremarkable. There is a small but otherwise unremarkable appendix. The
sigmoid colon is not distended in wall thickening cannot be completely excluded. The stomach is
nondistended except for the distal portion. It is otherwise unremarkable. There are areas of mild
wall thickening with intervening dilatation involving the small bowel the abdomen and pelvis.

Lymph nodes and mesentery: Normal.

Vasculature: There early vascular calcifications

Bladder: There is mild diffuse bladder wall thickening.

Reproductive organs: Normal.

Peritoneum: There is a small amount of ascites

Musculoskeletal structures: No significant abnormality.

Other: None.

IMPRESSION
1. Elevation of right hemidiaphragm with bilateral basilar atelectasis.
2. There is segmental small bowel loops with mild wall thickening and surrounding inflammation.
There is interval mild dilatation as well. The findings are suspicious for an inflammatory or
infectious enteritis with an associated ileus. Follow-up CT or obstructive series is recommended if
the patient's symptoms persist or worsen.
3. Small amount of ascites.
4. Diffuse bladder wall thickening.

Tech Notes:

c/o lower abd pain , N/V/D/ diagnosed with enteritis or gastroenteritis. onset 12 hours ago .

## 2022-05-24 MED ORDER — LEVEMIR FLEXTOUCH U100 INSULIN 100 UNIT/ML (3 ML) SC INPN
1 refills | Status: AC
Start: 2022-05-24 — End: ?

## 2022-05-24 MED ORDER — GVOKE HYPOPEN 2-PACK 1 MG/0.2 ML SC ATIN
ORAL | 1 refills | 16.00000 days | Status: AC
Start: 2022-05-24 — End: ?

## 2022-05-24 MED ORDER — DEXCOM G6 TRANSMITTER MISC DEVI
1 | Freq: Before meals | 3 refills | Status: AC
Start: 2022-05-24 — End: ?

## 2022-05-24 MED ORDER — OMNIPOD 5 G6 INTRO KIT (GEN 5) SC CRTG
1 | SUBCUTANEOUS | 0 refills | Status: AC
Start: 2022-05-24 — End: ?

## 2022-05-24 MED ORDER — DEXCOM G6 SENSOR MISC DEVI
1 | 0 refills | Status: AC
Start: 2022-05-24 — End: ?

## 2022-05-24 MED ORDER — OMNIPOD 5 G6 PODS (GEN 5) SC CRTG
1 | SUBCUTANEOUS | 3 refills | Status: AC
Start: 2022-05-24 — End: ?

## 2022-05-25 ENCOUNTER — Encounter: Admit: 2022-05-25 | Discharge: 2022-05-25 | Payer: MEDICARE

## 2022-05-25 DIAGNOSIS — Z794 Long term (current) use of insulin: Secondary | ICD-10-CM

## 2022-05-25 MED ORDER — ERGOCALCIFEROL (VITAMIN D2) 1,250 MCG (50,000 UNIT) PO CAP
1 | ORAL_CAPSULE | ORAL | 0 refills
Start: 2022-05-25 — End: ?

## 2022-06-01 ENCOUNTER — Encounter: Admit: 2022-06-01 | Discharge: 2022-06-01 | Payer: MEDICARE

## 2022-06-01 MED ORDER — LANTUS SOLOSTAR U-100 INSULIN 100 UNIT/ML (3 ML) SC INPN
1 refills | 68.00000 days | Status: AC
Start: 2022-06-01 — End: ?

## 2022-06-01 NOTE — Telephone Encounter
Received voicemail from patient  Patient's pharmacy does not carry Levemir anymore  Script for Lantus sent

## 2022-06-15 ENCOUNTER — Encounter: Admit: 2022-06-15 | Discharge: 2022-06-15 | Payer: MEDICARE

## 2022-06-15 NOTE — Telephone Encounter
Omnipod 5 Prescription received from [facility] pharmacies  Placed for French Ana to review/sign

## 2022-06-15 NOTE — Telephone Encounter
Called patient back and left detailed VM (ok per auth form) stating we were returning his call. Left call back number and asked that patient let us know in the voicemail what we can help him with.

## 2022-06-15 NOTE — Telephone Encounter
Patient called and left VM requesting a call back.

## 2022-06-15 NOTE — Telephone Encounter
Omnipod 5 script signed/reviewed  Faxed with last clinic notes  Confirmation received 06/15/2022 @ 1417  Original scanned to chart  closed

## 2022-06-21 ENCOUNTER — Encounter: Admit: 2022-06-21 | Discharge: 2022-06-21 | Payer: MEDICARE

## 2022-06-21 ENCOUNTER — Ambulatory Visit: Admit: 2022-06-21 | Discharge: 2022-06-22 | Payer: MEDICARE

## 2022-06-21 DIAGNOSIS — E119 Type 2 diabetes mellitus without complications: Secondary | ICD-10-CM

## 2022-06-21 DIAGNOSIS — M1990 Unspecified osteoarthritis, unspecified site: Secondary | ICD-10-CM

## 2022-06-21 DIAGNOSIS — E785 Hyperlipidemia, unspecified: Secondary | ICD-10-CM

## 2022-06-21 DIAGNOSIS — E8729 Ketoacidosis: Secondary | ICD-10-CM

## 2022-06-21 DIAGNOSIS — Z9889 Other specified postprocedural states: Secondary | ICD-10-CM

## 2022-06-21 DIAGNOSIS — G40409 Other generalized epilepsy and epileptic syndromes, not intractable, without status epilepticus: Secondary | ICD-10-CM

## 2022-06-21 DIAGNOSIS — I509 Heart failure, unspecified: Secondary | ICD-10-CM

## 2022-06-21 DIAGNOSIS — I420 Dilated cardiomyopathy: Secondary | ICD-10-CM

## 2022-06-21 DIAGNOSIS — M4802 Spinal stenosis, cervical region: Secondary | ICD-10-CM

## 2022-06-21 DIAGNOSIS — G4733 Obstructive sleep apnea (adult) (pediatric): Secondary | ICD-10-CM

## 2022-06-21 DIAGNOSIS — R0789 Other chest pain: Secondary | ICD-10-CM

## 2022-06-21 MED ORDER — LINZESS 145 MCG PO CAP
145 ug | ORAL_CAPSULE | Freq: Every day | ORAL | 0 refills | 30.00000 days | Status: AC
Start: 2022-06-21 — End: ?

## 2022-07-11 ENCOUNTER — Encounter: Admit: 2022-07-11 | Discharge: 2022-07-11 | Payer: MEDICARE

## 2022-07-17 ENCOUNTER — Encounter: Admit: 2022-07-17 | Discharge: 2022-07-17 | Payer: MEDICARE

## 2022-07-17 NOTE — Telephone Encounter
VM from pt reporting high cost of linzess and asking about any alternatives or suggestions.     On review of potential coverage in [employer] linzess is likely preferred by plan.    Call placed to pt. Discussed above. Pt confirmed that the pharmacy his insurance is covering a percentage of the cost but at this time he cannot afford the co-pay, around $700 for 90 days. Offered pt option to request tier exception with insurance. Pt asked to call his insurance and inquire where a tier exception form could be faxed for their review. Pt agreeable to this plan and will let this RN know where a tier exception form could be faxed.

## 2022-07-20 ENCOUNTER — Ambulatory Visit: Admit: 2022-07-20 | Discharge: 2022-07-21 | Payer: MEDICARE

## 2022-07-20 ENCOUNTER — Encounter: Admit: 2022-07-20 | Discharge: 2022-07-20 | Payer: MEDICARE

## 2022-07-20 DIAGNOSIS — E8729 Ketoacidosis: Secondary | ICD-10-CM

## 2022-07-20 DIAGNOSIS — R0789 Other chest pain: Secondary | ICD-10-CM

## 2022-07-20 DIAGNOSIS — E785 Hyperlipidemia, unspecified: Secondary | ICD-10-CM

## 2022-07-20 DIAGNOSIS — I509 Heart failure, unspecified: Secondary | ICD-10-CM

## 2022-07-20 DIAGNOSIS — E119 Type 2 diabetes mellitus without complications: Secondary | ICD-10-CM

## 2022-07-20 DIAGNOSIS — E109 Type 1 diabetes mellitus without complications: Secondary | ICD-10-CM

## 2022-07-20 DIAGNOSIS — G4733 Obstructive sleep apnea (adult) (pediatric): Secondary | ICD-10-CM

## 2022-07-20 DIAGNOSIS — Z9889 Other specified postprocedural states: Secondary | ICD-10-CM

## 2022-07-20 DIAGNOSIS — I420 Dilated cardiomyopathy: Secondary | ICD-10-CM

## 2022-07-20 DIAGNOSIS — M1990 Unspecified osteoarthritis, unspecified site: Secondary | ICD-10-CM

## 2022-07-20 DIAGNOSIS — G40409 Other generalized epilepsy and epileptic syndromes, not intractable, without status epilepticus: Secondary | ICD-10-CM

## 2022-07-20 DIAGNOSIS — M4802 Spinal stenosis, cervical region: Secondary | ICD-10-CM

## 2022-07-20 MED ORDER — GVOKE HYPOPEN 2-PACK 1 MG/0.2 ML SC ATIN
1 mg | SUBCUTANEOUS | 1 refills | 16.00000 days | Status: AC | PRN
Start: 2022-07-20 — End: ?

## 2022-07-20 MED ORDER — INSULIN LISPRO 100 UNIT/ML SC SOLN
100 [IU] | Freq: Every day | SUBCUTANEOUS | 3 refills | 30.00000 days | Status: DC
Start: 2022-07-20 — End: 2022-07-20

## 2022-07-20 MED ORDER — INSULIN LISPRO 100 UNIT/ML SC SOLN
100 [IU] | Freq: Every day | SUBCUTANEOUS | 3 refills | 30.00000 days | Status: AC
Start: 2022-07-20 — End: ?

## 2022-07-20 NOTE — Progress Notes
Date of Service: 07/20/2022    Subjective:             Karl Knapp is a 72 y.o. male.    History of Present Illness  Karl Knapp presents to [facility] at [facility] for management of diabetes mellitus. Herby's last visit was with me 02/12/2022    Type 2 Diabetes mellitus  Dx: 10 years ago    Father and Brother also have diabetes.  Brother has used insulin.     A1c recently 10.0.  Previous A1c was 10.0 in 08/2020  POC glucose is 260    Interval history:  Wearing CGM.  He now has Omnipod 5 which he is ready to start.    Current DM regimen: Levemir 46 units once daily, Humalog to 12-14 units plus sliding scale TID/BID with meals Metformin XR 2000 mg daily, Farxiga 10 mg once daily  Past treatment: none  Adherence to medications: patient reports compliance  FSBG frequency: twice daily monitoring, blood sugars range from 2 to 3.  Has Libre 2 CGM, uses twice daily  Hyperglycemia: yes  Hypoglycemia: yes - down to 60  Hypoglycemia unawareness?: no  Meals per day / Carb intake: 2 meals per day  Exercise: no - cardiomyopathy  Dyspnea/Chest pain with exertion: no  Last DM education / nutritionist visit: none    Complications of DM:  CAD: yes  CVA: No  PVD: No  Amputations: No  Retinopathy: No  -- Last Dilated Eye exam 2 years ago  Gastropathy: No  Nephropathy: yes - just started with nephorologist  Neuropathy: yes  Depression: No  Chronic wounds / delayed healing: No  DM related hospitalizations: No       Continuous Glucose Monitoring Analysis and Interpretation  Indication for Device Placement: Type 2 Insulin Dependent Diabetic, and frequent hypoglycemia (50mg ) episodes  Name/Type of Device Placed: FreeStyle Libre  Continuous glucose monitor downloaded and personally reviewed by me on 07/20/22.    Analysis of Data  Date Range: 14 days  Sensor usage time (%): 56  Average glucose (mg/dL): 161  Coefficient of Variation (%): 31.8  Time in range (%): 25  Time above range (%): 30              Time above 250mg /dL (%): 45  Time below range (%): 0              Time below 54mg /dL (%): 0    Assessment  Trends/Patterns observed  Hyperglycemia: Hyperglycemia present throughout the day, greatest after noon  Hypoglycemia: no recent hypoglycemia       Past Medical History:   Diagnosis Date    Arthritis 02/22/2009    Atypical chest pain 01/26/2009    Cardiomyopathy     Cervical stenosis of spine     CHF (congestive heart failure) (HCC)     Diabetes mellitus type 2, insulin dependent (HCC) 02/22/2009    History of back surgery 02/22/2009    Hyperlipemia     Ketoacidosis     Myoclonic epilepsy (HCC)     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   Respiratory:  Negative for shortness of breath.    Cardiovascular:  Negative for chest pain, palpitations and leg swelling.     Objective:          acyclovir (ZOVIRAX) 400 mg tablet Take one tablet by mouth every 24 hours.    amLODIPine (NORVASC) 10 mg tablet Take one tablet by mouth daily.    atorvastatin (LIPITOR) 40 mg tablet Take one tablet by mouth at bedtime daily.    baclofen (LIORESAL) 10 mg tablet Take one tablet by mouth twice daily.    carvedilol (COREG) 25 mg tablet Take one tablet by mouth twice daily with meals. Take with food.    cholecalciferol (VITAMIN D-3) 1,000 units tablet Take two tablets by mouth daily.    dapagliflozin (FARXIGA) 10 mg tablet Take one tablet by mouth daily.    DEXCOM G6 SENSOR sensor device Use one each as directed every 10 days.    DEXCOM G6 TRANSMITTER transmitter device Use one each as directed before meals and at bedtime. Indications: type 2 diabetes mellitus    divalproex (DEPAKOTE ER) 500 mg ER tablet Take one tablet by mouth three times daily. Take with food.    ergocalciferol (vitamin D2) (VITAMIN D2) 1,250 mcg (50,000 unit) capsule Take one capsule by mouth every 7 days.    escitalopram oxalate (LEXAPRO) 20 mg tablet TAKE ONE TABLET BY MOUTH EVERY DAY    gabapentin (NEURONTIN) 600 mg tablet Take one tablet by mouth twice daily.    glucagon (GVOKE HYPOPEN 2-PACK) 1 mg/0.2 mL syringe Inject 1 mg into the upper arm, thigh, or buttocks if needed for severe hypoglycemia and then call 911.  Indications: low blood sugar, person with diabetes mellitus at risk of hypoglycemia    HYDROcodone/acetaminophen(+) (NORCO) 10/325 mg tablet Take one tablet by mouth at bedtime daily.    insulin detemir U-100 (LEVEMIR FLEXTOUCH U100 INSULIN) 100 unit/mL (3 mL) injection pen 46 units once daily at bedtime with titration up to 60 units daily.  Indications: type 2 diabetes mellitus    insulin glargine (LANTUS SOLOSTAR U-100 INSULIN) 100 unit/mL (3 mL) subcutaneous PEN 46 units once daily at bedtime with titration up to 60 units daily. Indications: type 2 diabetes mellitus    insulin lispro(+) (HUMALOG KWIKPEN INSULIN) 100 unit/mL injection PEN Inject eighteen Units to twenty Units under the skin twice daily with meals.    insulin pump cart,auto,BT-cntr (OMNIPOD 5 G6 INTRO KIT (GEN 5)) cartridge Inject one each under the skin every 48 hours.    insulin pump cart,automated,BT (OMNIPOD 5 G6 PODS (GEN 5)) cartridge Inject one each under the skin every 48 hours.    linaCLOtide (LINZESS) 145 mcg capsule Take one capsule by mouth daily 30 minutes before breakfast. Indications: chronic idiopathic constipation    lisinopril (PRINIVIL; ZESTRIL) 20 mg tablet Take 1 Tab by mouth daily.    LORazepam (ATIVAN) 1 mg tablet Take 1 mg when nausea begins    magnesium oxide (MAGOX) 400 mg (241.3 mg magnesium) tablet Take one tablet by mouth twice daily.    metFORMIN-XR (GLUCOPHAGE XR) 500 mg extended release tablet Take four tablets by mouth daily with dinner. Indications: type 2 diabetes mellitus    naloxone (NARCAN) 4 mg/actuation nasal spray Insert one spray into nose as directed  as Needed.    ondansetron (ZOFRAN ODT) 4 mg rapid dissolve tablet Dissolve  by mouth as Needed.    oxyCODONE 10 mg tablet Take one tablet by mouth three times daily as needed.    pantoprazole DR (PROTONIX) 40 mg tablet Take one tablet by mouth daily.    promazine HCl (PROMAZINE PO) Take 25 mg by mouth.    promethazine (PHENERGAN) 25 mg tablet Take one tablet by mouth every 6 hours as needed for Nausea or Vomiting.    semaglutide (OZEMPIC) 0.25 mg or 0.5 mg (2 mg/3 mL) injection PEN Inject one-quarter mg under the skin every 7 days. Inject 0.25 mg once weekly x 4 weeks, then increase to 0.5 mg once weekly.  Indications: type 2 diabetes mellitus    SITagliptin phosphate (JANUVIA) 100 mg tablet Take one tablet by mouth daily.    zinc acetate (GALZIN) 50 mg (zinc) oral capsule Take one capsule by mouth daily.     Vitals:    07/20/22 0931   BP: 116/56   BP Source: Arm, Left Upper   Pulse: 70   Temp: 36.7 ?C (98.1 ?F)   PainSc: Zero   Weight: 92.1 kg (203 lb)   Height: 182.9 cm (6')      Body mass index is 27.53 kg/m?Marland Kitchen   BP Readings from Last 4 Encounters:   07/20/22 116/56   06/21/22 137/82   05/24/22 119/74   04/10/22 129/65      Wt Readings from Last 4 Encounters:   07/20/22 92.1 kg (203 lb)   06/21/22 91 kg (200 lb 11.2 oz)   05/24/22 88.5 kg (195 lb)   04/10/22 86.2 kg (190 lb)      Comprehensive Metabolic Profile    Lab Results   Component Value Date/Time    NA 134 (L) 04/10/2022 02:16 PM    K 5.4 (H) 04/10/2022 02:16 PM    CL 100 04/10/2022 02:16 PM    CO2 28 04/10/2022 02:16 PM    GAP 6 04/10/2022 02:16 PM    BUN 38 (H) 04/10/2022 02:16 PM    CR 1.49 (H) 04/10/2022 02:16 PM    GLU 400 (H) 04/10/2022 02:16 PM    Lab Results   Component Value Date/Time    CA 8.6 04/10/2022 02:16 PM    PO4 3.7 04/10/2022 02:16 PM    ALBUMIN 3.9 12/23/2021 12:00 AM    TOTPROT 7.2 12/23/2021 12:00 AM    ALKPHOS 59 12/23/2021 12:00 AM    AST 14 12/23/2021 12:00 AM    ALT 24 12/23/2021 12:00 AM    TOTBILI 0.80 12/23/2021 12:00 AM    GFR 40.8 02/15/2022 12:00 AM    GFRAA >60 09/10/2019 03:38 PM        No results found for: MCALB24   Microalbumin/CR ratio Urine   Date Value Ref Range Status   02/15/2022 101.7 <30.0 Final     Microalbumin, Random   Date Value Ref Range Status   02/15/2022 63.0 mg/dL Final       Lab Results   Component Value Date    CHOL 129 11/13/2021    TRIG 157 (H) 11/13/2021    HDL 34 (L) 11/13/2021    LDL 64 11/13/2021    VLDL 31 11/13/2021    CHOLHDLC 4 11/13/2021        TSH   Date Value Ref Range Status   11/13/2021 2.96  Final       Hemoglobin A1C   Date Value Ref Range Status  08/09/2020 9.0 (H) <5.7 Final   08/18/2019 8.1 (H) <5.7 Final   01/16/2017 9.3 (H) 4.5 - 6.5 Final     Poc Hemoglobin A1C   Date Value Ref Range Status   05/24/2022 10.0 (A) 4 - 6 % Final   02/12/2022 10.0 (A) 4 - 6 % Final         Lab Results   Component Value Date    PLTCT 191 02/15/2022        Screening for Fatty liver disease:   FIB 4 Score: Computed FIB-4 Calculation unavailable. Necessary lab results were not found in the last year.     Physical Exam  Constitutional:       Appearance: Normal appearance.   Pulmonary:      Effort: Pulmonary effort is normal.   Neurological:      Mental Status: He is alert and oriented to person, place, and time.          Assessment and Plan:  Diabetes mellitus type 2,  uncontrolled   A1c 10.0  in clinic today - Not At Goal   Target A1c <7% without significant or frequent hypoglycemia  Currently on Levemir 46 units daily, Humalog to 12-14 units plus sliding scale TID/BID with meals Metformin XR 2000 mg daily, Farxiga 10 mg   DM Complications: CAD  Assessment of glycemic control: blood sugars are consistently high    Plan:  Continue Levemir to 46 units once daily every day  Increase humalog at 15 units with every meal  Continue Farxiga 10 mg once daily  Continue metformin HCL ER 2000 mg daily.  Start OmniPod 5 and Dexcom G6 - working to get this approved, we will stp Levemir when OmniPod is started.  Discussed rule of 15 and how to tx hypoglycemia  Reminded pt to rotate injection sites  Reminded pt to have medical alert bracelet  Referred to meet with diabetes educator    Diabetic Co-morbidities - prevention and management:  - Annual labs (electrolytes and renal function): 04/10/2022  - Biannual dental visit:  2023  - Retinopathy - Annual eye exam: due now.   - Hypertension: yes . Goal BP <130/80.  - Dyslipidemia - yes statin - Atorvastatin 40 mg. Last lipid profile: 11/2021  - Nephropathy: yes. Annual Urine microalbumin/Cr: 02/2022.    ACE/ARB?: Lisinopril 20 mg daily  - Neuropathy: yes .  Monofilament exam (annual): 05/2022. Discussed foot care. Need DM shoes: no   - Diabetic Educator and/or nutritionist visit (annually): referred today    BMI 27.53  Discussed patient's BMI with him.  The body mass index is 27.53 kg/m?Marland Kitchen and falls within the category of Overweight (25 to <30); BMI plan is in progress.      Plan of care discussed with patient and patient is agreeable.      Total Time Today was 30 minutes in the following activities: Preparing to see the patient, Performing a medically appropriate examination and/or evaluation, Counseling and educating the patient/family/caregiver, Ordering medications, tests, or procedures, and Documenting clinical information in the electronic or other health record    RTC 3 months                         Patient Instructions   Thank you for meeting with me today.  It was nice to see you!    Plans we discussed during our visit:    1) We prescribed insulin in vials to work with your pump.  2)  We will work to get your sensors changed to Dexcom G6. I gave you about 70 days worth of Dexcom G6 sensors to get started with the pump, but your current supplier of [employer], will need to start shipping Dexcom  3) Please increase your Humalog to 15 units plus sliding  4) Continue levemir 46 units once daily  5) I would like to see a pump report two weeks after you start OmniPod 5 - please call or send me a message in my chart  6) If the OmniPods are only lasting 2 days, I need to know about this so I can update your prescription and make sure you have enough    Please message me in My Chart or contact my nurse Durenda Age at [phone] with any questions.    [employer], APRN-NP

## 2022-07-20 NOTE — Telephone Encounter
Received VM from pharmacy stating that the insulin lispro script was missing the diagnosis code. New script with diagnosis code has been sent.

## 2022-07-21 DIAGNOSIS — Z794 Long term (current) use of insulin: Secondary | ICD-10-CM

## 2022-07-21 DIAGNOSIS — E119 Type 2 diabetes mellitus without complications: Secondary | ICD-10-CM

## 2022-07-25 ENCOUNTER — Encounter: Admit: 2022-07-25 | Discharge: 2022-07-25 | Payer: MEDICARE

## 2022-07-25 NOTE — Telephone Encounter
Order for dexcom g6 submitted via parachute

## 2022-07-25 NOTE — Telephone Encounter
-----   Message from [employer], APRN-NP sent at 07/20/2022  9:51 AM CDT -----  Regarding: DEXCOM G6  Could you reach out to his sensor supplier - [phone] and get the patient changed from FreeStyle to Dexcom.  He is going to use OmniPod 5 and will need Dexcom G6 sensors.    French Ana

## 2022-08-02 ENCOUNTER — Encounter: Admit: 2022-08-02 | Discharge: 2022-08-02 | Payer: MEDICARE

## 2022-08-13 ENCOUNTER — Ambulatory Visit: Admit: 2022-08-13 | Discharge: 2022-08-14 | Payer: MEDICARE

## 2022-08-13 ENCOUNTER — Encounter: Admit: 2022-08-13 | Discharge: 2022-08-13 | Payer: MEDICARE

## 2022-08-13 DIAGNOSIS — E119 Type 2 diabetes mellitus without complications: Secondary | ICD-10-CM

## 2022-08-13 NOTE — Progress Notes
Diabetes Self-Management Education and Support [facility])  Received referral on 02/12/22 from [employer], APRN-NP for Diabetic Education.   Referral diagnosis: Diabetes mellitus type 2, insulin dependent.     Assessment of Participant  Diabetes Pathophysiology and Treatment Options  HgA1c HISTORY:  Lab Results   Component Value Date    HGBA1C 9.0 (H) 08/09/2020    HGBA1C 8.1 (H) 08/18/2019    HGBA1C 9.3 (H) 01/16/2017     Lab Results   Component Value Date    A1C 10.0 (A) 05/24/2022    A1C 10.0 (A) 02/12/2022     Meal Planning  Diet Recall:  Pt reports usually eating 3 meals per day. Pt states he is retired so meal choices and times vary. Pt states he is not counting carbohydrates stating he does not know how.    Taking Medications  Review of diabetes medications that patient reports currently taking:       Monitoring Glucose      Summary of Education Intervention/Comments  Pt is here with his wife for follow-up for diabetes education. Pt started on OmniPod 5 with Dexcom G6 last week. Pt reports he has had some difficulty with getting the Dexcom G6 to work. Pt is not counting carbohydrates so at meals giving himself a bolus rather than entering grams of carbohydrate. Reviewed with pt and wife carbohydrate counting today.    Handouts provided: Simple Carbohydrate Counting    Pt seen individually because patient requires additional insulin training    Education intervention and content taught provided by Bary Castilla, RD on 08/13/2022.    Education Intervention/Learning Outcomes  Diabetes Pathophysiology Content Taught/Learning Outcomes  Three options for treating diabetes: meal planning, exercise, medications: 3 - Comprehends key points  Healthy Eating Content Taught/Learning Outcomes  Effect of type, amount and timing of food on blood glucose: 3 - Comprehends key points  Three methods of planning meals: 3 - Comprehends key points     Taking Medications Content Taught/Learning Outcomes  Effect of diabetes medications on diabetes: 3 - Comprehends key points  Name of diabetes medication, taking, action and side effects: 3 - Comprehends key points    Monitoring Glucose Content Taught/Learning Outcomes  Recommended blood glucose testing times, target ranges and personal targets: 3 - Comprehends key points                        Behavioral Goal  Behavioral Outcomes  Behavioral Goal Area: Taking Medications  Behavioral Goal: Take Humalog before meals.  Behavior Goal Follow-up: 75% (Most of the time)    Clinical Outcome A1C  Clinical Outcome A1C  Baseline A1C: 10 %    DSMES Communicated with Referring Provider  Referring provider has shared EMR and has access to view education plan, education intervention, learning outcomes, behavioral outcomes, and clinical outcomes.    Follow-up: 12/17/22 Diabetes Education  10/22/22 Mackie Pai, APRN     Start Time: 10:00 am  Stop Time: 10:30 am

## 2022-08-16 ENCOUNTER — Encounter: Admit: 2022-08-16 | Discharge: 2022-08-16 | Payer: MEDICARE

## 2022-10-10 ENCOUNTER — Encounter: Admit: 2022-10-10 | Discharge: 2022-10-10 | Payer: MEDICARE

## 2022-10-10 ENCOUNTER — Ambulatory Visit: Admit: 2022-10-10 | Discharge: 2022-10-10 | Payer: MEDICARE

## 2022-10-10 DIAGNOSIS — I509 Heart failure, unspecified: Secondary | ICD-10-CM

## 2022-10-10 DIAGNOSIS — E785 Hyperlipidemia, unspecified: Secondary | ICD-10-CM

## 2022-10-10 DIAGNOSIS — E559 Vitamin D deficiency, unspecified: Secondary | ICD-10-CM

## 2022-10-10 DIAGNOSIS — M4802 Spinal stenosis, cervical region: Secondary | ICD-10-CM

## 2022-10-10 DIAGNOSIS — M1990 Unspecified osteoarthritis, unspecified site: Secondary | ICD-10-CM

## 2022-10-10 DIAGNOSIS — E8729 Ketoacidosis: Secondary | ICD-10-CM

## 2022-10-10 DIAGNOSIS — G4733 Obstructive sleep apnea (adult) (pediatric): Secondary | ICD-10-CM

## 2022-10-10 DIAGNOSIS — N1832 Stage 3b chronic kidney disease (HCC): Secondary | ICD-10-CM

## 2022-10-10 DIAGNOSIS — R0789 Other chest pain: Secondary | ICD-10-CM

## 2022-10-10 DIAGNOSIS — E119 Type 2 diabetes mellitus without complications: Secondary | ICD-10-CM

## 2022-10-10 DIAGNOSIS — Z9889 Other specified postprocedural states: Secondary | ICD-10-CM

## 2022-10-10 DIAGNOSIS — I420 Dilated cardiomyopathy: Secondary | ICD-10-CM

## 2022-10-10 DIAGNOSIS — G40409 Other generalized epilepsy and epileptic syndromes, not intractable, without status epilepticus: Secondary | ICD-10-CM

## 2022-10-10 LAB — PHOSPHORUS: PHOSPHORUS: 3.5 mg/dL (ref 2.0–4.5)

## 2022-10-10 LAB — PROTEIN/CR RATIO,UR RAN
PROT CREAT RAT/CAL: 0.3 — ABNORMAL HIGH (ref <0.15)
UR CREATININE, RAN: 105 mg/dL
UR TOTAL PROTEIN,RAN: 31 mg/dL

## 2022-10-10 LAB — BASIC METABOLIC PANEL
CHLORIDE: 104 MMOL/L (ref 98–110)
POTASSIUM: 4.6 MMOL/L (ref 3.5–5.1)
SODIUM: 141 MMOL/L (ref 137–147)

## 2022-10-10 LAB — PARATHYROID HORMONE: PTH HORMONE: 94 pg/mL — ABNORMAL HIGH (ref 10–65)

## 2022-10-10 LAB — 25-OH VITAMIN D (D2 + D3): VITAMIN D (25-OH) TOTAL: 24 ng/mL — ABNORMAL LOW (ref 30–80)

## 2022-10-10 MED ORDER — DEXCOM G6 SENSOR MISC DEVI
1 | 2 refills | Status: AC
Start: 2022-10-10 — End: ?

## 2022-10-10 MED ORDER — DEXCOM G6 TRANSMITTER MISC DEVI
1 | Freq: Before meals | 2 refills | Status: AC
Start: 2022-10-10 — End: ?

## 2022-10-10 NOTE — Patient Instructions
Labs (order placed) Please go to the lab today.  Please check BP and notify us if it is > 130 on top consistently.  Recommend regular exercise.  Check daily weight at home.  Encouraged hydrate with at least 50- 60 ounces of water daily.  Remember to avoid NSAIDs and contrast.  Recommend following low sodium diet, 2000 mg/day or less.   Contact Nurse Katelyn with any questions or concerns; she can be reached at [phone].   Follow up apt.  6 months with Dr. Doy Hutching, already scheduled

## 2022-10-19 ENCOUNTER — Encounter: Admit: 2022-10-19 | Discharge: 2022-10-19 | Payer: MEDICARE

## 2022-10-19 ENCOUNTER — Ambulatory Visit: Admit: 2022-10-19 | Discharge: 2022-10-20 | Payer: MEDICARE

## 2022-10-19 DIAGNOSIS — I509 Heart failure, unspecified: Secondary | ICD-10-CM

## 2022-10-19 DIAGNOSIS — E8729 Ketoacidosis: Secondary | ICD-10-CM

## 2022-10-19 DIAGNOSIS — E119 Type 2 diabetes mellitus without complications: Secondary | ICD-10-CM

## 2022-10-19 DIAGNOSIS — M4802 Spinal stenosis, cervical region: Secondary | ICD-10-CM

## 2022-10-19 DIAGNOSIS — E785 Hyperlipidemia, unspecified: Secondary | ICD-10-CM

## 2022-10-19 DIAGNOSIS — M1990 Unspecified osteoarthritis, unspecified site: Secondary | ICD-10-CM

## 2022-10-19 DIAGNOSIS — I420 Dilated cardiomyopathy: Secondary | ICD-10-CM

## 2022-10-19 DIAGNOSIS — E782 Mixed hyperlipidemia: Secondary | ICD-10-CM

## 2022-10-19 DIAGNOSIS — G40409 Other generalized epilepsy and epileptic syndromes, not intractable, without status epilepticus: Secondary | ICD-10-CM

## 2022-10-19 DIAGNOSIS — Z9889 Other specified postprocedural states: Secondary | ICD-10-CM

## 2022-10-19 DIAGNOSIS — E1159 Type 2 diabetes mellitus with other circulatory complications: Secondary | ICD-10-CM

## 2022-10-19 DIAGNOSIS — G4733 Obstructive sleep apnea (adult) (pediatric): Secondary | ICD-10-CM

## 2022-10-19 DIAGNOSIS — R0789 Other chest pain: Secondary | ICD-10-CM

## 2022-10-19 MED ORDER — DEXCOM G6 TRANSMITTER MISC DEVI
1 | Freq: Before meals | 2 refills | Status: AC
Start: 2022-10-19 — End: ?

## 2022-10-19 MED ORDER — DEXCOM G6 SENSOR MISC DEVI
1 | 2 refills | Status: AC
Start: 2022-10-19 — End: ?

## 2022-10-19 MED ORDER — METFORMIN 500 MG PO TB24
1000 mg | ORAL_TABLET | Freq: Every day | ORAL | 3 refills | Status: AC
Start: 2022-10-19 — End: ?

## 2022-10-19 NOTE — Telephone Encounter
Received voicemail from patient  Patient needing insurance verification to get Dexcom G6 reader,    Script previously sent to [facility]   Email out to [employer] rep to find out status

## 2022-10-19 NOTE — Progress Notes
Date of Service: 10/19/2022    Subjective:             Karl Knapp is a 72 y.o. male.    History of Present Illness  Karl Knapp presents to [facility] for management of diabetes mellitus. [name]'s last visit was with me 02/12/2022    Type 2 Diabetes mellitus  Dx: 10 years ago    Father and Brother also have diabetes.  Brother has used insulin.     A1c is 7.7% in clinic today, previously 10.0 05/24/2022  POC glucose is 173    Interval history:  Wearing CGM and OmniPod 5.  He has started OmniPod 5 since his last visit, he is happy with the device and feels it has improved his blood sugars.    Current DM regimen: OmniPod 5,  Metformin XR 2000 mg daily, Farxiga 10 mg once daily, Januvia 100 mg daily  Past treatment: Levemir (stopped for start of insulin pump)  Adherence to medications: patient reports compliance  FSBG frequency: twice daily monitoring, blood sugars range from 2 to 3.  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    Analysis of Data  Date Range: 14 days  Sensor usage time (%): 56  Average glucose (mg/dL): 960  Standard Deviation: 46  Time in range (%): 56  Time above range (%): 36              Time above 250mg /dL (%): 8  Time below range (%): 0              Time below 54mg /dL (%): 0    Assessment  Trends/Patterns observed  Hyperglycemia: Hyperglycemia present throughout the day, after eating  Hypoglycemia: no recent hypoglycemia       Past Medical History:   Diagnosis Date    Arthritis 02/22/2009    Atypical chest pain 01/26/2009    Cardiomyopathy     Cervical stenosis of spine     CHF (congestive heart failure) (HCC)     Diabetes mellitus type 2, insulin dependent (HCC) 02/22/2009    History of back surgery 02/22/2009    Hyperlipemia     Ketoacidosis     Myoclonic epilepsy (HCC)     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 Negative for activity change and appetite change.   Respiratory:  Negative for shortness of breath.    Cardiovascular:  Negative for chest pain, palpitations and leg swelling.     Objective:          acyclovir (ZOVIRAX) 400 mg tablet Take one tablet by mouth every 24 hours.    amLODIPine (NORVASC) 10 mg tablet Take one tablet by mouth daily.    atorvastatin (LIPITOR) 40 mg tablet Take one tablet by mouth at bedtime daily.    baclofen (LIORESAL) 10 mg tablet Take one tablet by mouth twice daily.    carvedilol (COREG) 25 mg tablet Take one tablet by mouth twice daily with meals. Take with food.    cholecalciferol (VITAMIN D-3) 1,000 units tablet Take two tablets by mouth daily.    dapagliflozin (FARXIGA) 10 mg tablet Take one tablet by mouth daily.    DEXCOM G6 SENSOR sensor device Use one each as directed every 10 days.    DEXCOM G6 TRANSMITTER transmitter device Use one each as directed before meals and at bedtime. Indications: type 2 diabetes mellitus    divalproex (DEPAKOTE ER) 500 mg ER tablet Take one tablet by mouth three times daily. Take with food.    ergocalciferol (vitamin D2) (VITAMIN D2) 1,250 mcg (50,000 unit) capsule Take one capsule by mouth every 7 days.    escitalopram oxalate (LEXAPRO) 20 mg tablet TAKE ONE TABLET BY MOUTH EVERY DAY    gabapentin (NEURONTIN) 600 mg tablet Take one tablet by mouth twice daily.    glucagon (GVOKE HYPOPEN 2-PACK) 1 mg/0.2 mL syringe Inject 0.2 mL under the skin as Needed. Inject 1 mg into the upper arm, thigh, or buttocks if needed for severe hypoglycemia and then call 911.    HYDROcodone/acetaminophen(+) (NORCO) 10/325 mg tablet Take one tablet by mouth at bedtime daily.    insulin detemir U-100 (LEVEMIR FLEXTOUCH U100 INSULIN) 100 unit/mL (3 mL) injection pen 46 units once daily at bedtime with titration up to 60 units daily.  Indications: type 2 diabetes mellitus    insulin glargine (LANTUS SOLOSTAR U-100 INSULIN) 100 unit/mL (3 mL) subcutaneous PEN 46 units once daily at bedtime with titration up to 60 units daily. Indications: type 2 diabetes mellitus    insulin lispro (HUMALOG U-100 INSULIN) 100 unit/mL injection Inject one hundred Units under the skin daily. Up to 100 units per day using insulin pump. E11.9  Indications: type 2 diabetes mellitus    insulin lispro(+) (HUMALOG KWIKPEN INSULIN) 100 unit/mL injection PEN Inject eighteen Units to twenty Units under the skin twice daily with meals.    insulin pump cart,auto,BT-cntr (OMNIPOD 5 G6 INTRO KIT (GEN 5)) cartridge Inject one each under the skin every 48 hours.    insulin pump cart,automated,BT (OMNIPOD 5 G6 PODS (GEN 5)) cartridge Inject one each under the skin every 48 hours.    linaCLOtide (LINZESS) 145 mcg capsule Take one capsule by mouth daily 30 minutes before breakfast. Indications: chronic idiopathic constipation    lisinopril (PRINIVIL; ZESTRIL) 20 mg tablet Take 1 Tab by mouth daily.    LORazepam (ATIVAN) 1 mg tablet Take 1 mg when nausea begins    magnesium oxide (MAGOX) 400 mg (241.3 mg magnesium)  tablet Take one tablet by mouth twice daily.    metFORMIN-XR (GLUCOPHAGE XR) 500 mg extended release tablet Take four tablets by mouth daily with dinner. Indications: type 2 diabetes mellitus    naloxone (NARCAN) 4 mg/actuation nasal spray Insert one spray into nose as directed as Needed.    ondansetron (ZOFRAN ODT) 4 mg rapid dissolve tablet Dissolve  by mouth as Needed.    oxyCODONE 10 mg tablet Take one tablet by mouth three times daily as needed.    pantoprazole DR (PROTONIX) 40 mg tablet Take one tablet by mouth daily.    promazine HCl (PROMAZINE PO) Take 25 mg by mouth.    promethazine (PHENERGAN) 25 mg tablet Take one tablet by mouth every 6 hours as needed for Nausea or Vomiting.    SITagliptin phosphate (JANUVIA) 100 mg tablet Take one tablet by mouth daily.    zinc acetate (GALZIN) 50 mg (zinc) oral capsule Take one capsule by mouth daily.     Vitals:    10/19/22 1025   BP: 129/66   BP Source: Arm, Left Upper   Pulse: 67   Temp: 36.6 ?C (97.9 ?F)   PainSc: Zero   Weight: 96.6 kg (213 lb)   Height: 182.9 cm (6')      Body mass index is 28.89 kg/m?Marland Kitchen   BP Readings from Last 4 Encounters:   10/19/22 129/66   10/10/22 106/61   07/20/22 116/56   06/21/22 137/82      Wt Readings from Last 4 Encounters:   10/19/22 96.6 kg (213 lb)   10/10/22 94.5 kg (208 lb 6.4 oz)   07/20/22 92.1 kg (203 lb)   06/21/22 91 kg (200 lb 11.2 oz)      Comprehensive Metabolic Profile    Lab Results   Component Value Date/Time    NA 141 10/10/2022 02:09 PM    K 4.6 10/10/2022 02:09 PM    CL 104 10/10/2022 02:09 PM    CO2 27 10/10/2022 02:09 PM    GAP 10 10/10/2022 02:09 PM    BUN 21 10/10/2022 02:09 PM    CR 1.48 (H) 10/10/2022 02:09 PM    GLU 234 (H) 10/10/2022 02:09 PM    Lab Results Component Value Date/Time    CA 9.3 10/10/2022 02:09 PM    PO4 3.5 10/10/2022 02:09 PM    ALBUMIN 3.9 12/23/2021 12:00 AM    TOTPROT 7.2 12/23/2021 12:00 AM    ALKPHOS 59 12/23/2021 12:00 AM    AST 14 12/23/2021 12:00 AM    ALT 24 12/23/2021 12:00 AM    TOTBILI 0.80 12/23/2021 12:00 AM    GFR 40.8 02/15/2022 12:00 AM    GFRAA >60 09/10/2019 03:38 PM        No results found for: MCALB24   Microalbumin/CR ratio Urine   Date Value Ref Range Status   02/15/2022 101.7 <30.0 Final     Microalbumin, Random   Date Value Ref Range Status   02/15/2022 63.0 mg/dL Final       Lab Results   Component Value Date    CHOL 129 11/13/2021    TRIG 157 (H) 11/13/2021    HDL 34 (L) 11/13/2021    LDL 64 11/13/2021    VLDL 31 11/13/2021    CHOLHDLC 4 11/13/2021        TSH   Date Value Ref Range Status   11/13/2021 2.96  Final       Hemoglobin A1C   Date Value Ref Range Status  08/09/2020 9.0 (H) <5.7 Final   08/18/2019 8.1 (H) <5.7 Final   01/16/2017 9.3 (H) 4.5 - 6.5 Final     Poc Hemoglobin A1C   Date Value Ref Range Status   05/24/2022 10.0 (A) 4 - 6 % Final   02/12/2022 10.0 (A) 4 - 6 % Final         Lab Results   Component Value Date    PLTCT 191 02/15/2022        Screening for Fatty liver disease:   FIB 4 Score: Computed FIB-4 Calculation unavailable. Necessary lab results were not found in the last year.     Physical Exam  Constitutional:       Appearance: Normal appearance.   Pulmonary:      Effort: Pulmonary effort is normal.   Neurological:      Mental Status: He is alert and oriented to person, place, and time.        Assessment and Plan:  Diabetes mellitus type 2,  uncontrolled   A1c 7.7  in clinic today - Not At Goal   Target A1c <7% without significant or frequent hypoglycemia  Currently on OmniPod 5, Metformin XR 2000 mg daily, Farxiga 10 mg, Januvia 100 mg daily   DM Complications: CAD  Assessment of glycemic control: improving    Plan:  Continue OmniPod 5 with Dexcom G7  In pump settings, lowered correction threshold from 130 to 110  Lowered BG target from 120 to 110  Decreased active insulin time from 3:00 to 2:30  Increased Max basal rate to 4 u/hr  Raised Basal program from 1.25 u/hr to 1/40 u/hr  Stop Januvia  Continue Farxiga 10 mg once daily  Decrease metformin HCL ER to 1000 mg daily - patient to verify dose.he has been taking  Discussed rule of 15 and how to tx hypoglycemia  Reminded pt to rotate injection sites  Reminded pt to have medical alert bracelet  Referred to meet with diabetes educator    Diabetic Co-morbidities - prevention and management:  - Annual labs (electrolytes and renal function): 10/10/2022  - Biannual dental visit:  2023  - Retinopathy - Annual eye exam: due now.   - Hypertension: yes . Goal BP <130/80.  - Dyslipidemia - yes statin - Atorvastatin 40 mg. Last lipid profile: 11/2021  - Nephropathy: yes. Annual Urine microalbumin/Cr: 02/2022.    ACE/ARB?: Lisinopril 20 mg daily  - Neuropathy: yes .  Monofilament exam (annual): 05/2022. Discussed foot care. Need DM shoes: no   - Diabetic Educator and/or nutritionist visit (annually): referred today    BMI 28.89  Discussed patient's BMI with him.  The body mass index is 28.89 kg/m?Marland Kitchen and falls within the category of Overweight (25 to <30); BMI plan is in progress.      Plan of care discussed with patient and patient is agreeable.      Total Time Today was 30 minutes in the following activities: Preparing to see the patient, Performing a medically appropriate examination and/or evaluation, Counseling and educating the patient/family/caregiver, Ordering medications, tests, or procedures, and Documenting clinical information in the electronic or other health record    RTC 3 months                         Patient Instructions   Thank you for meeting with me today.  It was nice to see you!    Plans we discussed during our visit:    1) Your  blood sugars are improving with OmniPod 5!  2) I have not ordered the new compatible OmniPod 5 G7 pods - because they are not widely available yet.  When available, we will change your Pod prescription and sensor prescription.  3) I have lowered your correction threshold from 130 to 110.  4) I have lowered your BG target from 120 to 110.  5) I have decreased your active insulin time from 3:00 to 2:30.  6) I have increased your max basal rate to 4 u/hr.  7) I raised your basal program from 1.25 u/hr to 1.40 u/hr.    Please message me in My Chart or contact my nurse Durenda Age at [phone] with any questions.    [employer], APRN-NP

## 2022-10-20 DIAGNOSIS — Z794 Long term (current) use of insulin: Secondary | ICD-10-CM

## 2022-10-22 ENCOUNTER — Encounter: Admit: 2022-10-22 | Discharge: 2022-10-22 | Payer: MEDICARE

## 2022-10-22 NOTE — Telephone Encounter
Received voicemail from patient  Patient wanting to know update on Dexcom.    Called patient asked if patient was wanting to use [facility] or [facility]  Patient to move forward with [facility] and to reach out about setting up his shipments.

## 2022-10-23 ENCOUNTER — Encounter: Admit: 2022-10-23 | Discharge: 2022-10-23 | Payer: MEDICARE

## 2022-10-23 NOTE — Telephone Encounter
Patient left voice message, he is getting his Dexcom G6 supplies via mail order supplier  Says he also needs to get his Omnipod 5 supplies with them also, needs them asap    Left message for patient, asking if he had reached out to [facility], DME provider he is getting his Dexcom supplies from, and discussed the plan of wanting to obtain his Omnipod  supplies from.  Let him know he needs to discuss this with them, so they can send over required prescription for completion and signing     Asked him to callback to possibly clarify his message, if this was not the intention from his message yesterday

## 2022-10-25 NOTE — Telephone Encounter
Spoke with Apolinar Junes from [facility]  Patient did speak with the patient on the 19th, and Dexcom G6 was sent out to patient  Omnipod 5 script has been sent out to this office

## 2022-11-13 ENCOUNTER — Encounter: Admit: 2022-11-13 | Discharge: 2022-11-13 | Payer: MEDICARE

## 2022-11-13 NOTE — Telephone Encounter
Received an Email with a DWO from [facility]/ Apolinar Junes  Completed form and placed to be emailed to [facility] for [employer]

## 2022-11-13 NOTE — Telephone Encounter
Received a call from Patient stating he is nearly out of Omnipod supplies  In checking chart see we spoke with Apolinar Junes at [facility] on 10/25/2022  He said at that time he would send an order for [place] to sign   I do not see that we received this order  Emailed Apolinar Junes to see what happened with it  Patient notified per VM and will let him know when I have a response from [place]

## 2022-11-20 ENCOUNTER — Encounter: Admit: 2022-11-20 | Discharge: 2022-11-20 | Payer: MEDICARE

## 2022-11-20 MED ORDER — OMNIPOD 5 G6 PODS (GEN 5) SC CRTG
1 | SUBCUTANEOUS | 3 refills | Status: AC
Start: 2022-11-20 — End: ?

## 2022-11-20 NOTE — Telephone Encounter
Patient left VM, he is needing his Omnipod 5 scripts sent to [place], [place], the same place as his Dexcom  The pharmacy they were sent to is the wrong pharmacy and too expensive    Left VM for patient to return call to clarify the script is to be sent to [facility], where is Dexcom orders had been sent

## 2022-11-20 NOTE — Telephone Encounter
Patient returned call to this nurse line so I could speak to him personally and not miss his call with his concerns  He verbalized that he needs his Omnipod Scripts sent to the place his Dexcom was sent to.  I clarified with patient that he was requesting the scripts be sent to [facility]  He said yes.    I verbalized orders had been faxed 11/15/2022, by Tracy's nurse. He stated they must not have received them because he hasn't heard anything from them  I let him know I would send another script at this time.  Escribed script at this time to [facility]    Routing to [place] so she is aware

## 2022-12-13 ENCOUNTER — Ambulatory Visit: Admit: 2022-12-13 | Discharge: 2022-12-14 | Payer: MEDICARE

## 2022-12-13 ENCOUNTER — Encounter: Admit: 2022-12-13 | Discharge: 2022-12-13 | Payer: MEDICARE

## 2022-12-17 ENCOUNTER — Encounter: Admit: 2022-12-17 | Discharge: 2022-12-17 | Payer: MEDICARE

## 2023-01-15 ENCOUNTER — Encounter: Admit: 2023-01-15 | Discharge: 2023-01-15 | Payer: MEDICARE

## 2023-01-15 DIAGNOSIS — E785 Hyperlipidemia, unspecified: Secondary | ICD-10-CM

## 2023-01-15 DIAGNOSIS — I420 Dilated cardiomyopathy: Secondary | ICD-10-CM

## 2023-01-15 DIAGNOSIS — R55 Syncope and collapse: Secondary | ICD-10-CM

## 2023-01-15 DIAGNOSIS — I2510 Atherosclerotic heart disease of native coronary artery without angina pectoris: Secondary | ICD-10-CM

## 2023-01-16 ENCOUNTER — Encounter: Admit: 2023-01-16 | Discharge: 2023-01-16 | Payer: MEDICARE

## 2023-01-16 ENCOUNTER — Ambulatory Visit: Admit: 2023-01-16 | Discharge: 2023-01-17 | Payer: MEDICARE

## 2023-01-21 ENCOUNTER — Ambulatory Visit: Admit: 2023-01-21 | Discharge: 2023-01-22 | Payer: MEDICARE

## 2023-01-21 ENCOUNTER — Encounter: Admit: 2023-01-21 | Discharge: 2023-01-21 | Payer: MEDICARE

## 2023-01-22 ENCOUNTER — Encounter: Admit: 2023-01-22 | Discharge: 2023-01-22 | Payer: MEDICARE

## 2023-01-24 ENCOUNTER — Ambulatory Visit: Admit: 2023-01-24 | Discharge: 2023-01-24 | Payer: MEDICARE

## 2023-01-24 ENCOUNTER — Encounter: Admit: 2023-01-24 | Discharge: 2023-01-24 | Payer: MEDICARE

## 2023-01-24 DIAGNOSIS — I2510 Atherosclerotic heart disease of native coronary artery without angina pectoris: Secondary | ICD-10-CM

## 2023-01-24 DIAGNOSIS — I420 Dilated cardiomyopathy: Secondary | ICD-10-CM

## 2023-01-24 DIAGNOSIS — I700 Atherosclerosis of aorta: Secondary | ICD-10-CM

## 2023-01-24 DIAGNOSIS — E785 Hyperlipidemia, unspecified: Secondary | ICD-10-CM

## 2023-01-24 DIAGNOSIS — R55 Syncope and collapse: Secondary | ICD-10-CM

## 2023-01-24 DIAGNOSIS — E119 Type 2 diabetes mellitus without complications: Secondary | ICD-10-CM

## 2023-01-24 LAB — CBC
HEMATOCRIT: 44
HEMOGLOBIN: 14
MCH: 31
MCHC: 33
MCV: 92 — ABNORMAL HIGH (ref 79.0–92.2)
MPV: 10
PLATELET COUNT: 197
RBC COUNT: 4.7
RDW: 12
WBC COUNT: 6.7

## 2023-01-24 NOTE — Progress Notes
Date of Service: 01/24/2023    Karl Knapp is a 72 y.o. male.       HPI        Karl Knapp was in the [facility] today for follow-up regarding his long history of idiopathic cardiomyopathy.  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  He initially had a lot of difficulty with heart failure manifestations but eventually this all settled down and his ejection fraction improved to the range of 45 to 50%.  He has pretty significant problems with diabetes but has managed to avoid too much trouble with vascular disease.    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  The most recent one shows a new area of ischemia in the inferior wall that wasn't apparent on the last regadenoson thallium study 2 years ago.    He's having episodes of exertional and non-exertional chest pressure that occasionally radiates to the left arm.  He hasn't used any NTG because he doesn't like the bad headache he gets.  He thinks that the angina symptoms started about 1 month ago.      He used to have trouble with obstructive sleep apnea but he is lost weight and no longer requires treatment.            Vitals:    01/24/23 1145   BP: 122/71   BP Source: Arm, Left Upper   Pulse: 75   SpO2: 96%   O2 Device: None (Room air)   PainSc: Zero   Weight: 94.1 kg (207 lb 6.4 oz)   Height: 182.9 cm (6')     Body mass index is 28.13 kg/m?Marland Kitchen     Past Medical History  Patient Active Problem List    Diagnosis Date Noted    Intolerance of continuous positive airway pressure (CPAP) ventilation 06/14/2016    Coronary artery disease due to calcified coronary lesion 08/04/2015     05/24/2015 - Cardiac Cath ([facility]):  L main normal, LAD mid-40%, mid-RCA 30%.  Distal RPD 85% (tortuous vessel).  Risk factor management recommended.      Syncope and collapse 05/02/2014    Acute encephalopathy 11/16/2013    Abdominal aortic atherosclerosis (HCC) 03/28/2010     03/2010 - incidental finding on CT-abdomen done for abdominal pain.  No AAA.      Screening for cardiovascular condition 03/28/2010    Erectile dysfunction 02/23/2009    Diabetes mellitus type 2, insulin dependent (HCC) 02/22/2009    Arthritis 02/22/2009    History of back surgery 02/22/2009     Removed discs w/fusion 5/10      Atypical chest pain 01/26/2009       4/00 - Cardiac cath: normal coronaries, EF 40 to 45%.         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          1989, 4/00 - Normal coronary arteries by catheterization.          1989 - RV biopsy demonstrating mild interstitial fibrosis and myocyte hypertrophy,                     no acute myocarditis.          Treatment with ACE inhibitor, digoxin - EF 40% - Class 2 CHF symptoms.  3/03 - EF 50-55% per echo.          6/05 - [facility]ED: EF 45%, mild LAE, mild MR and TR, non-ischemic.      Hyperlipidemia 01/26/2009     1999 - Zocor initiated.          6/05 - Vytorin initiated.      Sleep apnea, obstructive 01/26/2009      8/01 - Sleep study at [facility]. CPAP therapy recommended.          Intolerant of CPAP--insurance company denied heated humidifier.          8/08 -  Positive sleep study at [facility], nasal CPAP started with good result      Anxiety 01/26/2009         Review of Systems   Constitutional: Negative.   HENT: Negative.     Eyes: Negative.    Cardiovascular:  Positive for chest pain.   Respiratory: Negative.     Endocrine: Negative.    Hematologic/Lymphatic: Negative.    Skin: Negative.    Musculoskeletal: Negative.    Gastrointestinal: Negative.    Genitourinary: Negative.    Neurological: Negative.    Psychiatric/Behavioral: Negative.     Allergic/Immunologic: Negative.        Physical Exam    Physical Exam   General Appearance: no distress   Skin: warm, no ulcers or xanthomas   Digits and Nails: no cyanosis or clubbing Eyes: conjunctivae and lids normal, pupils are equal and round   Teeth/Gums/Palate: dentition unremarkable, no lesions   Lips & Oral Mucosa: no pallor or cyanosis   Neck Veins: normal JVP , neck veins are not distended   Thyroid: no nodules, masses, tenderness or enlargement   Chest Inspection: chest is normal in appearance   Respiratory Effort: breathing comfortably, no respiratory distress   Auscultation/Percussion: lungs clear to auscultation, no rales or rhonchi, no wheezing   PMI: PMI not enlarged or displaced   Cardiac Rhythm: regular rhythm and normal rate   Cardiac Auscultation: S1, S2 normal, no rub, no gallop   Murmurs: no murmur   Peripheral Circulation: normal peripheral circulation   Carotid Arteries: normal carotid upstroke bilaterally, no bruits   Radial Arteries: normal symmetric radial pulses   Abdominal Aorta: no abdominal aortic bruit   Pedal Pulses: normal symmetric pedal pulses   Lower Extremity Edema: no lower extremity edema   Abdominal Exam: soft, non-tender, no masses, bowel sounds normal   Liver & Spleen: no organomegaly   Gait & Station: walks without assistance   Muscle Strength: normal muscle tone   Orientation: oriented to time, place and person   Affect & Mood: appropriate and sustained affect   Language and Memory: patient responsive and seems to comprehend information   Neurologic Exam: neurological assessment grossly intact   Other: moves all extremities      Cardiovascular Health Factors  Vitals BP Readings from Last 3 Encounters:   01/24/23 122/71   01/21/23 100/50   10/19/22 129/66     Wt Readings from Last 3 Encounters:   01/24/23 94.1 kg (207 lb 6.4 oz)   01/21/23 94.8 kg (209 lb)   10/19/22 96.6 kg (213 lb)     BMI Readings from Last 3 Encounters:   01/24/23 28.13 kg/m?   01/21/23 28.35 kg/m?   10/19/22 28.89 kg/m?      Smoking Social History     Tobacco Use   Smoking Status Never   Smokeless Tobacco Never  Lipid Profile Cholesterol   Date Value Ref Range Status 01/23/2023 175 <200 mg/dL Final     HDL   Date Value Ref Range Status   01/23/2023 40 >=40 mg/dL Final     LDL   Date Value Ref Range Status   01/23/2023 117 <100 mg/dL Final     Triglycerides   Date Value Ref Range Status   01/23/2023 91 <150 mg/dL Final      Blood Sugar Hemoglobin A1C   Date Value Ref Range Status   08/09/2020 9.0 (H) <5.7 Final     Glucose   Date Value Ref Range Status   01/23/2023 73 70 - 105 mg/dL Final   21/30/8657 846 (H) 70 - 100 MG/DL Final   96/29/5284 132 (H) 70 - 100 MG/DL Final     Glucose, POC   Date Value Ref Range Status   01/21/2023 210 (A) 70 - 100 Final   10/19/2022 173 (A) 70 - 100 Final   07/20/2022 260 (A) 70 - 100 Final          Problems Addressed Today  Encounter Diagnoses   Name Primary?    Coronary artery disease due to calcified coronary lesion Yes    Hyperlipidemia, unspecified hyperlipidemia type     Primary idiopathic dilated cardiomyopathy (HCC)     Diabetes mellitus type 2, insulin dependent (HCC)        Assessment and Plan       Coronary artery disease due to calcified coronary lesion  Most recent cath in 2017 showed 80% distal RPDA stenosis, but no other areas of high-grade disease.    We've done surveillance stress testing and the reg/thal earlier this month showed a new RCA-distribution area of ischemia that appears new compared to the images from 2022.    He has recent-onset angina symptoms so I've recommended that we schedule coronary arteriography with PCI, if indicated.    Hyperlipidemia  Lab Results   Component Value Date    CHOL 129 11/13/2021    TRIG 157 (H) 11/13/2021    HDL 34 (L) 11/13/2021    LDL 64 11/13/2021    VLDL 31 11/13/2021    CHOLHDLC 4 11/13/2021      Atorva 40, treated to goal.    Primary idiopathic dilated cardiomyopathy (HCC)  Initial presentation with EF 40% in 1989.  Coronaries normal at that time.  Myocardial biopsy inconclusive at the time, but probably myocarditis because LV systolic function pretty promptly recovered with medical therapy.    Most recent echo in January, 2024, showed EF 55%.    Diabetes mellitus type 2, insulin dependent (HCC)  He follows with Dr. Threasa Beards, endocrinologist at [facility].  Hgb A1c 8.2% on 01/21/23.    Current Medications (including today's revisions)   acyclovir (ZOVIRAX) 400 mg tablet Take one tablet by mouth every 24 hours.    amLODIPine (NORVASC) 10 mg tablet Take one tablet by mouth daily.    atorvastatin (LIPITOR) 40 mg tablet Take one tablet by mouth at bedtime daily.    baclofen 5 mg tablet Take one tablet by mouth three times daily.    carvediloL (COREG) 12.5 mg tablet Take one tablet by mouth twice daily with meals. Take with food.    cholecalciferol (VITAMIN D-3) 1,000 units tablet Take two tablets by mouth daily.    dapagliflozin (FARXIGA) 10 mg tablet Take one tablet by mouth daily.    DEXCOM G6 SENSOR sensor device Use one each as directed every 10 days.  DEXCOM G6 TRANSMITTER transmitter device Use one each as directed before meals and at bedtime. Indications: type 2 diabetes mellitus    divalproex (DEPAKOTE ER) 500 mg ER tablet Take one tablet by mouth twice daily. Take with food.    ergocalciferol (vitamin D2) (VITAMIN D2) 1,250 mcg (50,000 unit) capsule Take one capsule by mouth every 7 days.    escitalopram oxalate (LEXAPRO) 20 mg tablet TAKE ONE TABLET BY MOUTH EVERY DAY (Patient taking differently: Take one tablet by mouth twice daily.)    gabapentin (NEURONTIN) 600 mg tablet Take one tablet by mouth twice daily.    glucagon (GVOKE HYPOPEN 2-PACK) 1 mg/0.2 mL syringe Inject 0.2 mL under the skin as Needed. Inject 1 mg into the upper arm, thigh, or buttocks if needed for severe hypoglycemia and then call 911.    HYDROcodone/acetaminophen(+) (NORCO) 10/325 mg tablet Take one tablet by mouth at bedtime daily.    insulin detemir U-100 (LEVEMIR FLEXTOUCH U100 INSULIN) 100 unit/mL (3 mL) injection pen 46 units once daily at bedtime with titration up to 60 units daily.  Indications: type 2 diabetes mellitus    insulin glargine (LANTUS SOLOSTAR U-100 INSULIN) 100 unit/mL (3 mL) subcutaneous PEN 46 units once daily at bedtime with titration up to 60 units daily. Indications: type 2 diabetes mellitus    insulin lispro (HUMALOG U-100 INSULIN) 100 unit/mL injection Inject one hundred Units under the skin daily. Up to 100 units per day using insulin pump. E11.9  Indications: type 2 diabetes mellitus    insulin pump cart,auto,BT-cntr (OMNIPOD 5 G6 INTRO KIT (GEN 5)) cartridge Inject one each under the skin every 48 hours.    linaCLOtide (LINZESS) 145 mcg capsule Take one capsule by mouth daily 30 minutes before breakfast. Indications: chronic idiopathic constipation    lisinopril (PRINIVIL; ZESTRIL) 20 mg tablet Take 1 Tab by mouth daily. (Patient taking differently: Take two tablets by mouth daily.)    LORazepam (ATIVAN) 1 mg tablet Take 1 mg when nausea begins    magnesium oxide (MAGOX) 400 mg (241.3 mg magnesium) tablet Take one tablet by mouth twice daily.    metFORMIN-XR (GLUCOPHAGE XR) 500 mg extended release tablet Take two tablets by mouth daily with dinner. Indications: type 2 diabetes mellitus    naloxone (NARCAN) 4 mg/actuation nasal spray Insert one spray into nose as directed as Needed.    OMNIPOD 5 G6 PODS (GEN 5) cartridge Inject one each under the skin every 48 hours. E11.9    ondansetron (ZOFRAN ODT) 4 mg rapid dissolve tablet Dissolve  by mouth as Needed.    oxyCODONE 10 mg tablet Take one tablet by mouth three times daily as needed.    pantoprazole DR (PROTONIX) 40 mg tablet Take one tablet by mouth daily.    promazine HCl (PROMAZINE PO) Take 25 mg by mouth.    promethazine (PHENERGAN) 25 mg tablet Take one tablet by mouth every 6 hours as needed for Nausea or Vomiting.    SITagliptin phosphate (JANUVIA) 100 mg tablet Take one tablet by mouth daily.    zinc acetate (GALZIN) 50 mg (zinc) oral capsule Take one capsule by mouth daily.     Total time spent on today's office visit was 45 minutes.  This includes face-to-face in person visit with patient as well as nonface-to-face time including review of the EMR, outside records, labs, radiologic studies, echocardiogram & other cardiovascular studies, formation of treatment plan, after visit summary, future disposition, and lastly on documentation.

## 2023-01-24 NOTE — Assessment & Plan Note
He follows with Dr. Threasa Beards, endocrinologist at [facility].  Hgb A1c 8.2% on 01/21/23.

## 2023-01-24 NOTE — Assessment & Plan Note
Initial presentation with EF 40% in 1989.  Coronaries normal at that time.  Myocardial biopsy inconclusive at the time, but probably myocarditis because LV systolic function pretty promptly recovered with medical therapy.    Most recent echo in January, 2024, showed EF 55%.

## 2023-01-24 NOTE — Assessment & Plan Note
Lab Results   Component Value Date    CHOL 129 11/13/2021    TRIG 157 (H) 11/13/2021    HDL 34 (L) 11/13/2021    LDL 64 11/13/2021    VLDL 31 11/13/2021    CHOLHDLC 4 11/13/2021      Atorva 40, treated to goal.

## 2023-01-25 ENCOUNTER — Encounter: Admit: 2023-01-25 | Discharge: 2023-01-25 | Payer: MEDICARE

## 2023-01-25 NOTE — Progress Notes
Medicare is listed as patient's primary insurance coverage.  Pre-certification is not required for hospitalizations.

## 2023-02-04 ENCOUNTER — Encounter: Admit: 2023-02-04 | Discharge: 2023-02-04 | Payer: MEDICARE

## 2023-02-04 ENCOUNTER — Ambulatory Visit: Admit: 2023-02-04 | Discharge: 2023-02-04 | Payer: MEDICARE

## 2023-02-06 ENCOUNTER — Encounter: Admit: 2023-02-06 | Discharge: 2023-02-06 | Payer: MEDICARE

## 2023-02-06 DIAGNOSIS — I2510 Atherosclerotic heart disease of native coronary artery without angina pectoris: Secondary | ICD-10-CM

## 2023-02-06 NOTE — Patient Instructions
CARDIAC CATHETERIZATION   PRE-ADMISSION INSTRUCTIONS    Patient Name: Karl Knapp  MRN#: [id number]  Date of Birth: 03/16/1950 (72 y.o.)  Today's Date: 02/06/2023    PROCEDURE:  You are scheduled for a Coronary Angiogram with possible Angioplasty/Stenting of Chronic Total Occlusion Right Coronary Artery with Dr. Francine Graven.    PROCEDURE DATE AND ARRIVAL TIME:  Your procedure date is 02/25/2023.  You will receive a call from the Cath lab staff between 8:00 a.m. and noon on the business day prior to your procedure to let you know at what time to arrive on the day of your procedure.    Please check in at the Admitting Desk in the [facility] for your procedure.   Address:  [address] [place], [place] [address]    [facility] Entrance and and take a right. Continue down the hallway past the Cardiovascular Medicine office. That hall will take [facility] into the [facility]. Check in at the desk on the left side.)     (If you have further questions regarding your arrival time for the CV lab, please call [phone] by 3:00pm the day before your procedure. Please leave a message with your name and number, your call will be returned in a timely manner.)    PRE-PROCEDURE APPOINTMENTS:    AFTER 12/9 AND PRIOR TO 02/20/23     Pre-Admission lab work required within 14 days of procedure: CBC and CMP at the lab of your choice.         FOOD AND DRINK INSTRUCTIONS  Nothing to eat after midnight before your procedure. No caffeine for 24 hours prior to your procedure. You will be under moderate sedation for your procedure.  You may drink clear liquids up to an hour before hospital arrival. This will be confirmed by the Cath lab staff the day before your procedure.     SPECIAL MEDICATION INSTRUCTIONS  Any new prescriptions will be sent to your pharmacy listed on file with [place].     Insulin:   HOLD insulin the morning of the procedure  TAKE 1/3 of normal evening lantus/levemir dose    Hypoglycemics:   metformin (Glucophage) -- HOLD the morning of your procedure  sitagliptin (Januvia) -- HOLD the morning of your procedure.    HOLD ALL erectile dysfunction medications for 3 days, unless prescribed for pulmonary hypertension.  HOLD ALL over the counter vitamins or supplements on the morning of your procedure.    TAKE AM OF PROCEDURE:  TAKE EITHER 4 BABY ASPIRIN or 1 FULL STRENGTH NON-COATED ASPIRIN THE MORNING OF YOUR PROCEDURE  TAKE all other prescription medications not addressed above as prescribed.       Additional Instructions  If you wear CPAP, please bring your mask and machine with you to the hospital.    Take a bath or shower with anti-bacterial soap the evening before, or the morning of the procedure.     Bring photo ID and your health insurance card(s).    Arrange for a driver to take you home from the hospital. Please arrange for a friend or family member to take you home from this test. You cannot take a Taxi, [employer]etto Goad, or public transportation as there has to be a responsible person to help care for you after sedation    Bring an accurate list of your current medications with you to the hospital (all medications and supplements taken daily).  Please use the medication list below and write in the date and  time when you took your last dose before your procedure. Update this list of medications as needed.      Wear comfortable clothes and don't bring valuables, other than photo identification card, with you to the hospital.    Please pack a bag for an overnight stay.     Please review your pre-procedure instructions and bring them with you on the day of your procedure.  Call the office at  [phone]  with any questions. You may ask to speak with Dr. Lanora Manis nurse.        ALLERGIES  Allergies   Allergen Reactions    Sulfa (Sulfonamide Antibiotics) HIVES    Fentanyl AGITATION       CURRENT MEDICATIONS  Outpatient Encounter Medications as of 02/06/2023   Medication Sig Dispense Refill    acyclovir (ZOVIRAX) 400 mg tablet Take one tablet by mouth every 24 hours.      amLODIPine (NORVASC) 10 mg tablet Take one tablet by mouth daily.      atorvastatin (LIPITOR) 40 mg tablet Take one tablet by mouth at bedtime daily. 90 tablet 3    baclofen 5 mg tablet Take one tablet by mouth three times daily.      carvediloL (COREG) 12.5 mg tablet Take one tablet by mouth twice daily with meals. Take with food.      cholecalciferol (VITAMIN D-3) 1,000 units tablet Take two tablets by mouth daily.      dapagliflozin (FARXIGA) 10 mg tablet Take one tablet by mouth daily.      DEXCOM G6 SENSOR sensor device Use one each as directed every 10 days. 9 each 2    DEXCOM G6 TRANSMITTER transmitter device Use one each as directed before meals and at bedtime. Indications: type 2 diabetes mellitus 1 each 2    divalproex (DEPAKOTE ER) 500 mg ER tablet Take one tablet by mouth twice daily. Take with food.      ergocalciferol (vitamin D2) (VITAMIN D2) 1,250 mcg (50,000 unit) capsule Take one capsule by mouth every 7 days. 12 capsule 0    escitalopram oxalate (LEXAPRO) 20 mg tablet TAKE ONE TABLET BY MOUTH EVERY DAY (Patient taking differently: Take one tablet by mouth twice daily.) 90 tablet 0    gabapentin (NEURONTIN) 600 mg tablet Take one tablet by mouth twice daily.      glucagon (GVOKE HYPOPEN 2-PACK) 1 mg/0.2 mL syringe Inject 0.2 mL under the skin as Needed. Inject 1 mg into the upper arm, thigh, or buttocks if needed for severe hypoglycemia and then call 911. 0.4 mL 1    HYDROcodone/acetaminophen(+) (NORCO) 10/325 mg tablet Take one tablet by mouth at bedtime daily.      insulin lispro (HUMALOG U-100 INSULIN) 100 unit/mL injection Inject one hundred Units under the skin daily. Up to 100 units per day using insulin pump. E11.9  Indications: type 2 diabetes mellitus 100 mL 3    insulin pump cart,auto,BT-cntr (OMNIPOD 5 G6 INTRO KIT (GEN 5)) cartridge Inject one each under the skin every 48 hours. 10 each 0    linaCLOtide (LINZESS) 145 mcg capsule Take one capsule by mouth daily 30 minutes before breakfast. Indications: chronic idiopathic constipation 90 capsule 1    lisinopril (PRINIVIL; ZESTRIL) 20 mg tablet Take 1 Tab by mouth daily. (Patient taking differently: Take two tablets by mouth daily.) 90 Tab 3    LORazepam (ATIVAN) 1 mg tablet Take 1 mg when nausea begins 30 tablet 0    magnesium oxide (MAGOX) 400 mg (  241.3 mg magnesium) tablet Take one tablet by mouth twice daily.      metFORMIN-XR (GLUCOPHAGE XR) 500 mg extended release tablet Take two tablets by mouth daily with dinner. Indications: type 2 diabetes mellitus 180 tablet 3    naloxone (NARCAN) 4 mg/actuation nasal spray Insert one spray into nose as directed as Needed.      OMNIPOD 5 G6 PODS (GEN 5) cartridge Inject one each under the skin every 48 hours. E11.9 45 each 3    ondansetron (ZOFRAN ODT) 4 mg rapid dissolve tablet Dissolve  by mouth as Needed.      oxyCODONE 10 mg tablet Take one tablet by mouth three times daily as needed.      pantoprazole DR (PROTONIX) 40 mg tablet Take one tablet by mouth daily.      promethazine (PHENERGAN) 25 mg tablet Take one tablet by mouth every 6 hours as needed for Nausea or Vomiting. 60 tablet 1    zinc acetate (GALZIN) 50 mg (zinc) oral capsule Take one capsule by mouth daily.       No facility-administered encounter medications on file as of 02/06/2023.       _________________________________________  Form completed by: Leafy Half, RN  Date completed: 02/06/23  Method: Via telephone and mailed to the patient.

## 2023-02-06 NOTE — Telephone Encounter
Return call from/to patient; spoke to patient and wife. Pt accepts 02/25/23 at procedure date and understands will need labs after 12/9 and prior to 12/23. Will fax lab reqs to [facility] in [place] and also [facility] with patient instructions for procedure. Reviewed medication holds for morning of procedure and that they would call the Friday prior between 8-12 with an arrival time. Verbalize understanding of all and denies additional questions at this time.

## 2023-02-14 ENCOUNTER — Encounter: Admit: 2023-02-14 | Discharge: 2023-02-14 | Payer: MEDICARE

## 2023-02-20 LAB — COMPREHENSIVE METABOLIC PANEL
ANION GAP: 10
BLD UREA NITROGEN: 23
CALCIUM: 9.1
CHLORIDE: 106
CO2: 26
CREATININE: 1.3
POTASSIUM: 4.5
SODIUM: 142
TOTAL BILIRUBIN: 0.6
TOTAL PROTEIN: 6.3

## 2023-02-20 LAB — CBC
MCH: 30
MCHC: 33
MCV: 92
MPV: 10
PLATELET COUNT: 182
WBC COUNT: 6

## 2023-02-21 ENCOUNTER — Encounter: Admit: 2023-02-21 | Discharge: 2023-02-21 | Payer: MEDICARE

## 2023-02-21 DIAGNOSIS — I2510 Atherosclerotic heart disease of native coronary artery without angina pectoris: Secondary | ICD-10-CM

## 2023-02-25 ENCOUNTER — Encounter: Admit: 2023-02-25 | Discharge: 2023-02-25 | Payer: MEDICARE

## 2023-02-25 ENCOUNTER — Ambulatory Visit: Admit: 2023-02-25 | Discharge: 2023-02-25 | Payer: MEDICARE

## 2023-03-01 ENCOUNTER — Encounter: Admit: 2023-03-01 | Discharge: 2023-03-01 | Payer: MEDICARE

## 2023-03-01 NOTE — Telephone Encounter
-----   Message from [place] P sent at 02/25/2023  1:53 PM CST -----  Teressa Lower,    This patient is in the hospital, so I haven't reached out to him yet, but Dawn and I were both a little confused as to why Francee Piccolo sent this to [place] since Dr. Micheline Rough wrote on his cath lab report on 02/04/23 that he was going to have Dr. Anda Kraft take care of both blockages. Since I wasn't quite sure what to do, I decided to leave it for today. Thanks, Annice Pih  ----- Message -----  From: Sylvester Harder, APRN-NP  Sent: 02/25/2023  10:46 AM CST  To: Roderic Palau, MD; #    Hello,     Mr. 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 Anda Kraft via 2 stents.  Dr. Anda Kraft would like the patient to return for staged intervention to his left anterior descending artery in approximately 3 to 4 weeks.  Seeing as the first cath was done by Dr. Micheline Rough, Dr. Anda Kraft recommended that I reach out to Dr. Lars Masson team to work on getting this scheduled for the patient.  I will schedule the patient for post intervention follow-up which can serve as a preprocedure H&P before return to intervention.      Can you please help arrange the return intervention to the LAD with Dr. Micheline Rough in the next 3-4 weeks?     Thank you,   Francee Piccolo

## 2023-03-01 NOTE — Telephone Encounter
LM for patient regarding scheduling heart cath for 1/22.

## 2023-03-04 ENCOUNTER — Encounter: Admit: 2023-03-04 | Discharge: 2023-03-04 | Payer: MEDICARE

## 2023-03-04 MED ORDER — DEXCOM G7 SENSOR MISC DEVI
1 | 3 refills | Status: AC
Start: 2023-03-04 — End: ?

## 2023-03-04 NOTE — Telephone Encounter
Received voicemail from patient  Patient needing refill on Dexcom G7  Refilled per etranscribe

## 2023-03-04 NOTE — Progress Notes
CARDIAC CATHETERIZATION   PRE-ADMISSION INSTRUCTIONS    Patient Name: Karl Knapp  MRN#: [id number]  Date of Birth: [birth year] (72 y.o.)  Today's Date: 03/04/2023    PROCEDURE:  You are scheduled for a Coronary Angiogram with possible Angioplasty/Stenting with Dr. Greig Castilla.    PROCEDURE DATE AND ARRIVAL TIME:  Your procedure date is January 22nd.  You will receive a call from the Cath lab staff between 8:00 a.m. and noon on the business day prior to your procedure to let you know at what time to arrive on the day of your procedure.    Please check in at the Admitting Desk in the [facility] for your procedure.   Address:  [address], [place], [place] [address]    [facility] Entrance and and take a right. Continue down the hallway past the Cardiovascular Medicine office. That hall will take you into the [facility]. Check in at the desk on the left side.)     (If you have further questions regarding your arrival time for the CV lab, please call [phone] by 3:00pm the day before your procedure. Please leave a message with your name and number, your call will be returned in a timely manner.)    PRE-PROCEDURE APPOINTMENTS:    January 10th at 1PM   Office visit to update history and physical (requirement within 30 days of procedure)  with  Mervyn Gay APRN-NP  at Cardiovascular Medicine [facility]: [address], [address], [place], [address]         January 10th   Pre-Admission lab work required within 14 days of procedure: BMP and CBC at the lab of your choice.         FOOD AND DRINK INSTRUCTIONS  Nothing to eat after midnight before your procedure. No caffeine for 24 hours prior to your procedure. You will be under moderate sedation for your procedure.  You may drink clear liquids up to an hour before hospital arrival. This will be confirmed by the Cath lab staff the day before your procedure.     SPECIAL MEDICATION INSTRUCTIONS  Any new prescriptions will be sent to your pharmacy listed on file with [place].        Please either take 4 baby aspirins (4 times 81mg ) or one full strength NON-COATED 325mg  aspirin.  clopidogrel (Plavix) -- ok to take with aspirin   Hypoglycemics: metformin (Glucophage) -- hold the morning of your procedure.      Take all other medications with a sip of water.    HOLD ALL erectile dysfunction medications for 3 days, unless prescribed for pulmonary hypertension.  HOLD ALL over the counter vitamins or supplements on the morning of your procedure.      Additional Instructions  If you wear CPAP, please bring your mask and machine with you to the hospital.    Take a bath or shower with anti-bacterial soap the evening before, or the morning of the procedure.     Bring photo ID and your health insurance card(s).    Arrange for a driver to take you home from the hospital. Please arrange for a friend or family member to take you home from this test. You cannot take a Taxi, [employer] Goad, or public transportation as there has to be a responsible person to help care for you after sedation    Bring an accurate list of your current medications with you to the hospital (all medications and supplements  taken daily).  Please use the medication list below and write in the date and time when you took your last dose before your procedure. Update this list of medications as needed.      Wear comfortable clothes and don't bring valuables, other than photo identification card, with you to the hospital.    Please pack a bag for an overnight stay.     Please review your pre-procedure instructions and bring them with you on the day of your procedure.  Call the office at [phone] with any questions. You may ask to speak with Dr. Reola Mosher nurse.        ALLERGIES  Allergies   Allergen Reactions    Sulfa (Sulfonamide Antibiotics) HIVES    Fentanyl AGITATION       CURRENT MEDICATIONS  Outpatient Encounter Medications as of 03/04/2023   Medication Sig Dispense Refill    acyclovir (ZOVIRAX) 400 mg tablet Take one tablet by mouth every 24 hours.      amLODIPine (NORVASC) 10 mg tablet Take one tablet by mouth daily.      aspirin (ASPIRIN CHILDRENS) 81 mg chewable tablet Chew one tablet by mouth daily. Indications: treatment to prevent peripheral artery thromboembolism      atorvastatin (LIPITOR) 40 mg tablet Take one tablet by mouth at bedtime daily. 90 tablet 3    baclofen 5 mg tablet Take one tablet by mouth three times daily.      carvediloL (COREG) 12.5 mg tablet Take one tablet by mouth twice daily with meals. Take with food.      cholecalciferol (VITAMIN D-3) 1,000 units tablet Take two tablets by mouth daily.      clopiDOGreL (PLAVIX) 75 mg tablet Take one tablet by mouth daily. Indications: blood clot prevention following percutaneous coronary intervention 90 tablet 3    dapagliflozin (FARXIGA) 10 mg tablet Take one tablet by mouth daily.      DEXCOM G7 SENSOR sensor device Use one each as directed every 10 days. Indications: type 2 diabetes mellitus 9 each 3    divalproex (DEPAKOTE ER) 500 mg ER tablet Take one tablet by mouth twice daily. Take with food.      ergocalciferol (vitamin D2) (VITAMIN D2) 1,250 mcg (50,000 unit) capsule Take one capsule by mouth every 7 days. 12 capsule 0    escitalopram oxalate (LEXAPRO) 20 mg tablet TAKE ONE TABLET BY MOUTH EVERY DAY (Patient taking differently: Take one tablet by mouth twice daily.) 90 tablet 0    gabapentin (NEURONTIN) 600 mg tablet Take one tablet by mouth twice daily.      glucagon (GVOKE HYPOPEN 2-PACK) 1 mg/0.2 mL syringe Inject 0.2 mL under the skin as Needed. Inject 1 mg into the upper arm, thigh, or buttocks if needed for severe hypoglycemia and then call 911. 0.4 mL 1    HYDROcodone/acetaminophen(+) (NORCO) 10/325 mg tablet Take one tablet by mouth at bedtime daily.      insulin lispro (HUMALOG U-100 INSULIN) 100 unit/mL injection Inject one hundred Units under the skin daily. Up to 100 units per day using insulin pump. E11.9  Indications: type 2 diabetes mellitus 100 mL 3    insulin pump cart,auto,BT-cntr (OMNIPOD 5 G6 INTRO KIT (GEN 5)) cartridge Inject one each under the skin every 48 hours. 10 each 0    linaCLOtide (LINZESS) 145 mcg capsule Take one capsule by mouth daily 30 minutes before breakfast. Indications: chronic idiopathic constipation 90 capsule 1    lisinopril (PRINIVIL; ZESTRIL) 20 mg tablet  Take 1 Tab by mouth daily. (Patient taking differently: Take two tablets by mouth daily.) 90 Tab 3    LORazepam (ATIVAN) 1 mg tablet Take 1 mg when nausea begins 30 tablet 0    magnesium oxide (MAGOX) 400 mg (241.3 mg magnesium) tablet Take one tablet by mouth twice daily.      metFORMIN-XR (GLUCOPHAGE XR) 500 mg extended release tablet Take two tablets by mouth daily with dinner. Indications: type 2 diabetes mellitus 180 tablet 3    naloxone (NARCAN) 4 mg/actuation nasal spray Insert one spray into nose as directed as Needed.      OMNIPOD 5 G6 PODS (GEN 5) cartridge Inject one each under the skin every 48 hours. E11.9 45 each 3    ondansetron (ZOFRAN ODT) 4 mg rapid dissolve tablet Dissolve  by mouth as Needed.      oxyCODONE 10 mg tablet Take one tablet by mouth three times daily as needed.      pantoprazole DR (PROTONIX) 40 mg tablet Take one tablet by mouth daily.      promethazine (PHENERGAN) 25 mg tablet Take one tablet by mouth every 6 hours as needed for Nausea or Vomiting. 60 tablet 1    zinc acetate (GALZIN) 50 mg (zinc) oral capsule Take one capsule by mouth daily.       No facility-administered encounter medications on file as of 03/04/2023.       _________________________________________  Form completed by: Vevelyn Royals, RN  Date completed: 03/04/23  Method: Via MyChart.

## 2023-03-07 ENCOUNTER — Encounter: Admit: 2023-03-07 | Discharge: 2023-03-07 | Payer: MEDICARE

## 2023-03-09 ENCOUNTER — Encounter: Admit: 2023-03-09 | Discharge: 2023-03-09 | Payer: MEDICARE

## 2023-03-11 ENCOUNTER — Encounter: Admit: 2023-03-11 | Discharge: 2023-03-11 | Payer: MEDICARE

## 2023-03-13 ENCOUNTER — Encounter: Admit: 2023-03-13 | Discharge: 2023-03-13 | Payer: MEDICARE

## 2023-03-13 NOTE — Progress Notes
Medicare is listed as patient's primary insurance coverage.  Pre-certification is not required for hospitalizations.

## 2023-03-15 ENCOUNTER — Encounter: Admit: 2023-03-15 | Discharge: 2023-03-15 | Payer: MEDICARE

## 2023-03-19 ENCOUNTER — Encounter: Admit: 2023-03-19 | Discharge: 2023-03-19 | Payer: MEDICARE

## 2023-03-19 NOTE — Telephone Encounter
Patient called and LVM that his Dexcom G7 sensors prescription needs to go to [place], [place] because [facility] is unable to fill them for some reason. Requesting a call back.     Called patient back. He would like to switch DME companies if possible and would like to get only a 1 month supply at a time due to financial constraints. Let patient know I would submit orders to [facility] and will send them a note that you would like to just fill a 1 month supply at a time. Patient denied any further questions or concerns at this time.

## 2023-03-20 ENCOUNTER — Encounter: Admit: 2023-03-20 | Discharge: 2023-03-20 | Payer: MEDICARE

## 2023-03-20 ENCOUNTER — Ambulatory Visit: Admit: 2023-03-20 | Discharge: 2023-03-21 | Payer: MEDICARE

## 2023-03-21 ENCOUNTER — Encounter: Admit: 2023-03-21 | Discharge: 2023-03-21 | Payer: MEDICARE

## 2023-03-21 LAB — COMPREHENSIVE METABOLIC PANEL
ALT: 14
ANION GAP: 10
BLD UREA NITROGEN: 30
CHLORIDE: 109
CO2: 22
POTASSIUM: 4.8 — ABNORMAL LOW

## 2023-03-21 LAB — CBC AND DIFF
BASOPHILS %: 1
HEMATOCRIT: 36 — ABNORMAL LOW
HEMOGLOBIN: 12 — ABNORMAL LOW
MCH: 31
MCHC: 33
MCV: 94 — ABNORMAL HIGH
RBC COUNT: 3.8 — ABNORMAL LOW
WBC COUNT: 4.9

## 2023-03-22 ENCOUNTER — Encounter: Admit: 2023-03-22 | Discharge: 2023-03-22 | Payer: MEDICARE

## 2023-03-22 DIAGNOSIS — Z79899 Other long term (current) drug therapy: Secondary | ICD-10-CM

## 2023-03-22 DIAGNOSIS — I2510 Atherosclerotic heart disease of native coronary artery without angina pectoris: Secondary | ICD-10-CM

## 2023-03-22 DIAGNOSIS — G40909 Epilepsy, unspecified, not intractable, without status epilepticus: Secondary | ICD-10-CM

## 2023-03-27 ENCOUNTER — Encounter: Admit: 2023-03-27 | Discharge: 2023-03-27 | Payer: MEDICARE

## 2023-03-27 ENCOUNTER — Encounter: Admit: 2023-03-27 | Discharge: 2023-03-28 | Payer: MEDICARE

## 2023-04-02 ENCOUNTER — Encounter: Admit: 2023-04-02 | Discharge: 2023-04-02 | Payer: MEDICARE

## 2023-04-09 ENCOUNTER — Encounter: Admit: 2023-04-09 | Discharge: 2023-04-09 | Payer: MEDICARE

## 2023-04-09 ENCOUNTER — Ambulatory Visit: Admit: 2023-04-09 | Discharge: 2023-04-09 | Payer: MEDICARE

## 2023-04-09 DIAGNOSIS — D638 Anemia in other chronic diseases classified elsewhere: Secondary | ICD-10-CM

## 2023-04-09 DIAGNOSIS — N1832 Stage 3b chronic kidney disease (HCC): Secondary | ICD-10-CM

## 2023-04-09 DIAGNOSIS — N2581 Secondary hyperparathyroidism of renal origin: Secondary | ICD-10-CM

## 2023-04-09 DIAGNOSIS — I10 Essential (primary) hypertension: Secondary | ICD-10-CM

## 2023-04-09 NOTE — Patient Instructions
Please do not hesitate to call with any questions.  My nurse Dahlia Client can be reached [phone].       Return to clinic in 1 year

## 2023-04-09 NOTE — Progress Notes
History        CC: CKD     HPI: Karl Knapp is a 73 y.o. male here for follow up on his CKD.  He has no new complaints today      Medication     acyclovir (ZOVIRAX) 400 mg tablet Take one tablet by mouth every 24 hours.    amLODIPine (NORVASC) 10 mg tablet Take one tablet by mouth daily.    aspirin (ASPIRIN CHILDRENS) 81 mg chewable tablet Chew one tablet by mouth daily. Indications: treatment to prevent peripheral artery thromboembolism    atorvastatin (LIPITOR) 40 mg tablet Take one tablet by mouth at bedtime daily.    baclofen 5 mg tablet Take one tablet by mouth three times daily.    carvediloL (COREG) 12.5 mg tablet Take one tablet by mouth twice daily with meals. Take with food.    cholecalciferol (VITAMIN D-3) 1,000 units tablet Take two tablets by mouth daily.    clopiDOGreL (PLAVIX) 75 mg tablet Take one tablet by mouth daily. Indications: blood clot prevention following percutaneous coronary intervention    dapagliflozin (FARXIGA) 10 mg tablet Take one tablet by mouth daily.    DEXCOM G7 SENSOR sensor device Use one each as directed every 10 days. Indications: type 2 diabetes mellitus    divalproex (DEPAKOTE ER) 500 mg ER tablet Take one tablet by mouth twice daily. Take with food.    ergocalciferol (vitamin D2) (VITAMIN D2) 1,250 mcg (50,000 unit) capsule Take one capsule by mouth every 7 days.    escitalopram oxalate (LEXAPRO) 20 mg tablet TAKE ONE TABLET BY MOUTH EVERY DAY    gabapentin (NEURONTIN) 600 mg tablet Take one tablet by mouth twice daily.    glucagon (GVOKE HYPOPEN 2-PACK) 1 mg/0.2 mL syringe Inject 0.2 mL under the skin as Needed. Inject 1 mg into the upper arm, thigh, or buttocks if needed for severe hypoglycemia and then call 911.    HYDROcodone/acetaminophen(+) (NORCO) 10/325 mg tablet Take one tablet by mouth at bedtime daily.    insulin lispro (HUMALOG U-100 INSULIN) 100 unit/mL injection Inject one hundred Units under the skin daily. Up to 100 units per day using insulin pump. E11.9  Indications: type 2 diabetes mellitus    insulin pump cart,auto,BT-cntr (OMNIPOD 5 G6 INTRO KIT (GEN 5)) cartridge Inject one each under the skin every 48 hours.    linaCLOtide (LINZESS) 145 mcg capsule Take one capsule by mouth daily 30 minutes before breakfast. Indications: chronic idiopathic constipation    lisinopril (PRINIVIL; ZESTRIL) 20 mg tablet Take 1 Tab by mouth daily.    LORazepam (ATIVAN) 1 mg tablet Take 1 mg when nausea begins    magnesium oxide (MAGOX) 400 mg (241.3 mg magnesium) tablet Take one tablet by mouth twice daily.    metFORMIN-XR (GLUCOPHAGE XR) 500 mg extended release tablet Take two tablets by mouth daily with dinner. Indications: type 2 diabetes mellitus    OMNIPOD 5 G6 PODS (GEN 5) cartridge Inject one each under the skin every 48 hours. E11.9    ondansetron (ZOFRAN ODT) 4 mg rapid dissolve tablet Dissolve  by mouth as Needed.    oxyCODONE 10 mg tablet Take one tablet by mouth three times daily as needed.    pantoprazole DR (PROTONIX) 40 mg tablet Take one tablet by mouth daily.    promethazine (PHENERGAN) 25 mg tablet Take one tablet by mouth every 6 hours as needed for Nausea or Vomiting.    zinc acetate (GALZIN) 50 mg (zinc) oral capsule  Take one capsule by mouth daily.            Physical        Vitals:    04/09/23 1413   BP: (P) 129/66   BP Source: (P) Arm, Right Upper   PainSc: Four   Weight: 96.1 kg (211 lb 12.8 oz)   Height: 182.9 cm (6')     Body mass index is 28.73 kg/m?.    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  My nurse Dahlia Client can be reached [phone].       Return to clinic in 1 year

## 2023-04-10 ENCOUNTER — Encounter: Admit: 2023-04-10 | Discharge: 2023-04-10 | Payer: MEDICARE

## 2023-04-10 MED ORDER — OMNIPOD 5 G6-G7 PODS (GEN 5) SC CRTG
1 | SUBCUTANEOUS | 3 refills | Status: AC
Start: 2023-04-10 — End: ?

## 2023-04-10 NOTE — Telephone Encounter
Patient called and LVM that he is needing a refill of his Omnipods sent into the same place he gets his Dexcom from.    Patient is receiving Dexcom supplies from [facility] and they did not provide omnipodss.    Called patient back to discuss. Explained that we would not be able to have him get his omnipods through [facility]. Patient reports that the omnipods are expensive from [facility]. Offered alternative of sending prescription to his insurance preferred mail order pharmacy, [facility], and explained that this could greatly reduce the price. Let patient know I would send him a MyChart message with their phone number to call if he does not hear from them. No further questions or concerns at this time.    MyChart message sent to patient. Rx sent to [facility].

## 2023-04-11 ENCOUNTER — Encounter: Admit: 2023-04-11 | Discharge: 2023-04-11 | Payer: MEDICARE

## 2023-04-11 ENCOUNTER — Ambulatory Visit: Admit: 2023-04-11 | Discharge: 2023-04-11 | Payer: MEDICARE

## 2023-04-12 ENCOUNTER — Encounter: Admit: 2023-04-12 | Discharge: 2023-04-12 | Payer: MEDICARE

## 2023-04-16 ENCOUNTER — Encounter: Admit: 2023-04-16 | Discharge: 2023-04-16 | Payer: MEDICARE

## 2023-04-18 ENCOUNTER — Encounter: Admit: 2023-04-18 | Discharge: 2023-04-18 | Payer: MEDICARE

## 2023-04-21 ENCOUNTER — Encounter: Admit: 2023-04-21 | Discharge: 2023-04-21 | Payer: MEDICARE

## 2023-04-22 ENCOUNTER — Encounter: Admit: 2023-04-22 | Discharge: 2023-04-22 | Payer: MEDICARE

## 2023-04-26 ENCOUNTER — Encounter: Admit: 2023-04-26 | Discharge: 2023-04-26 | Payer: MEDICARE

## 2023-04-29 ENCOUNTER — Encounter: Admit: 2023-04-29 | Discharge: 2023-04-29 | Payer: MEDICARE

## 2023-05-04 ENCOUNTER — Encounter: Admit: 2023-05-04 | Discharge: 2023-05-04 | Payer: MEDICARE

## 2023-05-06 ENCOUNTER — Encounter: Admit: 2023-05-06 | Discharge: 2023-05-06 | Payer: MEDICARE

## 2023-05-06 ENCOUNTER — Ambulatory Visit: Admit: 2023-05-06 | Discharge: 2023-05-07 | Payer: MEDICARE

## 2023-05-09 ENCOUNTER — Encounter: Admit: 2023-05-09 | Discharge: 2023-05-09 | Payer: MEDICARE

## 2023-05-09 NOTE — Telephone Encounter
 Received fax request from [employer] requesting concurrent cpeptide and fasting glucose for the patient. Results printed and faxed.

## 2023-05-13 ENCOUNTER — Encounter: Admit: 2023-05-13 | Discharge: 2023-05-13 | Payer: MEDICARE

## 2023-05-29 ENCOUNTER — Encounter: Admit: 2023-05-29 | Discharge: 2023-05-29 | Payer: MEDICARE

## 2023-05-29 ENCOUNTER — Ambulatory Visit: Admit: 2023-05-29 | Discharge: 2023-05-30 | Payer: MEDICARE

## 2023-05-29 DIAGNOSIS — H04129 Dry eye syndrome of unspecified lacrimal gland: Secondary | ICD-10-CM

## 2023-05-29 NOTE — Progress Notes
 Assessment and Plan:    Problem   Dry Eye   Diabetes Mellitus Type 2, Insulin Dependent (Cms-Hcc)       Diabetes mellitus type 2, insulin dependent (CMS-HCC)  Does the patient have Diabetes? Yes,  and the patient HAS evidence of retinopathy and/or macular edema today.    Dry eye  Encouraged continual use of Ats.     Arturo Morton MD  PGY-1 Ophthalmology          No follow-ups on file.    ATTESTATION    I personally performed the key portions of the E/M visit, discussed case with resident and concur with resident documentation of history, physical exam, assessment, and treatment plan unless otherwise noted.    Staff name:  Delight Hoh, MD Date: 05/29/2023     Patient presents for evaluation of blurred vision at distance, present at all times.  Exam shows MGD/DES, implants clear, mild ERM OD, no DR appreciated.  Recommend compresses and tears, strict BG control.  Recheck In 3 months.        Next Visit:  MRx x   IOP    Pachy    Dilate    OCT    HVF               HPI:  Patient presents with:  Diabetic Eye Exam: Annual diabetic examination. Reports that vision at 20-25 feet for distance is blurry. This started about 3 months ago. He last had an eye examination in January and updated his glasses and wore them in clinic today. He complains that he has double vision that is staggered up and down. This is not a new issue but a current complaints.   Blood Sugar Readings: He has an average blood sugar range from 114-300. His last A1C was recently and results were an 8.4. He gets them every 3 months. Had diabetes for 10+ years. On insulin, metformin.  Occasionally uses Ats which he has used BID, no change in the blurriness.   Has had cataract surgery OU and vitrectomy OD.       Exam:  Base Eye Exam       Visual Acuity (Snellen - Linear)         Right Left    Dist cc 20/20 -2 20/20 -1      Correction: Glasses              Tonometry (Tonopen, 2:07 PM)         Right Left    Pressure 13 13              Pupils Pupils Dark    Right PERRL 3    Left PERRL 3              Visual Fields         Left Right     Full Full              Extraocular Movement         Right Left     Full, Ortho Full, Ortho              Neuro/Psych       Oriented x3: Yes              Dilation       Both eyes: 1.0% Tropicamide, 2.5% Phenylephrine @ 2:07 PM                  Slit Lamp and Fundus Exam  Slit Lamp Exam         Right Left    Lids/Lashes Normal Normal    Conjunctiva/Sclera Ranessa Kosta and quiet Vaness Jelinski and quiet    Cornea Punctate epithelial erosions Punctate epithelial erosions    Anterior Chamber Deep and quiet Deep and quiet    Iris Flat Flat    Lens Posterior chamber intraocular lens Posterior chamber intraocular lens    Anterior Vitreous Normal PVD              Fundus Exam         Right Left    Disc Sharp healthy rim Sharp healthy rim    C/D Ratio 0.3 0.3    Macula Flat Flat    Vessels Normal caliber and number Normal caliber and number, DBH    Periphery Attached no breaks or tears Attached no breaks or tears                  Refraction       Wearing Rx         Sphere Cylinder Axis Add    Right -0.75 +0.50 068 +2.50    Left -0.75 +0.50 033 +2.50              Manifest Refraction         Sphere Cylinder Axis Dist VA Add    Right -0.50 +0.25 068 20/20 +2.50    Left -1.25 +0.75 034 20/20 +2.50                        Body mass index is 28.07 kg/m?Marland Kitchen             Assessment and Plan:    Problem   Dry Eye   Diabetes Mellitus Type 2, Insulin Dependent (Cms-Hcc)       Diabetes mellitus type 2, insulin dependent (CMS-HCC)  Does the patient have Diabetes? Yes,  and the patient HAS evidence of retinopathy and/or macular edema today.    Dry eye  Encouraged continual use of Ats.

## 2023-05-29 NOTE — Assessment & Plan Note
 Encouraged continual use of Ats.

## 2023-05-29 NOTE — Assessment & Plan Note
Does the patient have Diabetes? Yes,  and the patient HAS evidence of retinopathy and/or macular edema today.

## 2023-05-30 DIAGNOSIS — E119 Type 2 diabetes mellitus without complications: Secondary | ICD-10-CM

## 2023-05-30 DIAGNOSIS — H35371 Puckering of macula, right eye: Secondary | ICD-10-CM

## 2023-05-30 DIAGNOSIS — Z794 Long term (current) use of insulin: Secondary | ICD-10-CM

## 2023-06-13 ENCOUNTER — Encounter: Admit: 2023-06-13 | Discharge: 2023-06-13

## 2023-07-09 ENCOUNTER — Encounter: Admit: 2023-07-09 | Discharge: 2023-07-09 | Payer: MEDICARE

## 2023-08-12 ENCOUNTER — Encounter: Admit: 2023-08-12 | Discharge: 2023-08-12 | Payer: MEDICARE

## 2023-08-12 ENCOUNTER — Ambulatory Visit: Admit: 2023-08-12 | Discharge: 2023-08-13 | Payer: MEDICARE

## 2023-08-13 ENCOUNTER — Encounter: Admit: 2023-08-13 | Discharge: 2023-08-13 | Payer: MEDICARE

## 2023-08-16 ENCOUNTER — Encounter: Admit: 2023-08-16 | Discharge: 2023-08-16 | Payer: MEDICARE

## 2023-08-16 NOTE — Progress Notes
 Fax received from [employer] with documentation of completed Mobi pump training.     Placed in PSR basket to scan into chart.

## 2023-09-02 ENCOUNTER — Encounter: Admit: 2023-09-02 | Discharge: 2023-09-02 | Payer: MEDICARE

## 2023-09-27 ENCOUNTER — Encounter: Admit: 2023-09-27 | Discharge: 2023-09-27 | Payer: MEDICARE

## 2023-09-27 MED ORDER — INSULIN LISPRO 100 UNIT/ML SC SOLN
100 [IU] | Freq: Every day | SUBCUTANEOUS | 1 refills | 30.00000 days | Status: AC
Start: 2023-09-27 — End: ?

## 2023-09-30 ENCOUNTER — Encounter: Admit: 2023-09-30 | Discharge: 2023-09-30 | Payer: MEDICARE

## 2023-10-21 ENCOUNTER — Encounter: Admit: 2023-10-21 | Discharge: 2023-10-21 | Payer: MEDICARE

## 2023-10-22 ENCOUNTER — Encounter: Admit: 2023-10-22 | Discharge: 2023-10-22 | Payer: MEDICARE

## 2023-10-22 ENCOUNTER — Ambulatory Visit: Admit: 2023-10-22 | Discharge: 2023-10-22 | Payer: MEDICARE

## 2023-10-22 DIAGNOSIS — E119 Type 2 diabetes mellitus without complications: Secondary | ICD-10-CM

## 2023-10-22 DIAGNOSIS — I10 Essential (primary) hypertension: Secondary | ICD-10-CM

## 2023-10-22 DIAGNOSIS — Z136 Encounter for screening for cardiovascular disorders: Principal | ICD-10-CM

## 2023-10-22 DIAGNOSIS — E782 Mixed hyperlipidemia: Secondary | ICD-10-CM

## 2023-10-22 DIAGNOSIS — I420 Dilated cardiomyopathy: Secondary | ICD-10-CM

## 2023-10-22 DIAGNOSIS — I2510 Atherosclerotic heart disease of native coronary artery without angina pectoris: Secondary | ICD-10-CM

## 2023-10-22 NOTE — Assessment & Plan Note
 Lab Results   Component Value Date    CHOL 175 01/23/2023    TRIG 91 01/23/2023    HDL 40 01/23/2023    LDL 117 01/23/2023    VLDL 18 01/23/2023    CHOLHDLC 4 01/23/2023      Atorvastatin 40 mg/day

## 2023-10-22 NOTE — Progress Notes
 RE: recurrent angina  Received: Today  Spaedy, Curtistine PARAS, MD  Quin Elspeth BIRCH, MD; P Cvm Nurse Interventional Team [place]  I reviewed his records.  He did have complex anatomy.  I think would be reasonable to set him up at some point for cath as you mentioned.  Thanks for the note!    Koren          Previous Messages       ----- Message -----  From: Quin Elspeth BIRCH, MD  Sent: 10/22/2023  12:26 PM CDT  To: Curtistine PARAS Fallow, MD  Subject: recurrent angina                                You did a RCA CTO procedure in November and he did well until about a month ago when he says he's started to have the same exertional dyspnea and chest discomfort.  I'm inclined to just set him up for cors/possible with you when you return from [facility] in September.  Please let me know if you think I should pursue stress testing or even just medical management before sending him back to the Lab, however!  Thanks, Koren!

## 2023-10-22 NOTE — Assessment & Plan Note
 EF was 41% on the ischemic MPI from last November.  Echo 02/25/23 showed EF 50-55%, sclerotic AV.

## 2023-10-22 NOTE — Telephone Encounter
-----   Message from Garnette HERO sent at 10/22/2023 11:58 AM CDT -----  Regarding: needs lvcors possible  Saw SDO today need LVCORS Possible SDO would prefer [facility] since he did his CTO .  He is aware he is not in lab till 9/9.  Patient given lab orders.  Please let me know if anything else required.

## 2023-10-22 NOTE — Telephone Encounter
 Called pt to discuss scheduling for cardiac catheterization.  Left message with call back number.

## 2023-10-22 NOTE — Progress Notes
 Date of Service: 10/22/2023    Karl Knapp is a 73 y.o. male.       HPI     Karl Knapp was in the [facility] today for a work-in visit regarding recurrent angina symptoms.  He'd presented with some exertional dyspnea and chest discomfort last Fall and a MPI was ischemic in RCA distribution.  He ultimately underwent CTO PCI by Dr. Emilia for an occluded RCA.  He got a great result and his symptoms resolved until just about a month ago.  He's started having almost exactly the same symptoms he had last year.  He hasn't used any SL NTG and none of the episodes have lasted longer than 10 minutes or so.    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  He initially had a lot of difficulty with heart failure manifestations but eventually this all settled down and his ejection fraction improved to the range of 45 to 50% on GDMT.  He has pretty significant problems with diabetes but has managed to avoid too much trouble with vascular disease.     He used to have trouble with obstructive sleep apnea but he is lost weight and no longer requires treatment.            Vitals:    10/22/23 1121   BP: (!) 159/90   BP Source: Arm, Left Upper   Pulse: 68   SpO2: 96%   O2 Device: None (Room air)   PainSc: Six   Weight: 96.8 kg (213 lb 6.4 oz)   Height: 182.9 cm (6')     Body mass index is 28.94 kg/m?SABRA     Past Medical History  Patient Active Problem List    Diagnosis Date Noted    Dry eye 05/29/2023    Primary hypertension 03/21/2023    Intolerance of continuous positive airway pressure (CPAP) ventilation 06/14/2016    Coronary artery disease due to calcified coronary lesion 08/04/2015     05/24/2015 - Cardiac Cath ([facility]):  L main normal, LAD mid-40%, mid-RCA 30%.  Distal RPD 85% (tortuous vessel).  Risk factor management recommended.      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  No AAA.      Screening for cardiovascular condition 03/28/2010    Erectile dysfunction 02/23/2009    Diabetes mellitus type 2, insulin  dependent (CMS-HCC) 02/22/2009    Arthritis 02/22/2009    History of back surgery 02/22/2009     Removed discs w/fusion 5/10      Atypical chest pain 01/26/2009       4/00 - Cardiac cath: normal coronaries, EF 40 to 45%.         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          1989, 4/00 - Normal coronary arteries by catheterization.          1989 - RV biopsy demonstrating mild interstitial fibrosis and myocyte hypertrophy,                     no acute myocarditis.          Treatment with ACE inhibitor, digoxin - EF 40% - Class 2 CHF symptoms.  3/03 - EF 50-55% per echo.          6/05 - [facility]: EF 45%, mild LAE, mild MR and TR, non-ischemic.      Hyperlipidemia 01/26/2009     1999 - Zocor initiated.          6/05 - Vytorin initiated.      Sleep apnea, obstructive 01/26/2009      8/01 - Sleep study at [facility]. CPAP therapy recommended.          Intolerant of CPAP--insurance company denied heated humidifier.          8/08 -  Positive sleep study at [facility], nasal CPAP started with good result      Anxiety 01/26/2009         Review of Systems   Constitutional: Negative.   HENT: Negative.     Eyes: Negative.    Cardiovascular:  Positive for dyspnea on exertion.   Respiratory:  Positive for shortness of breath.    Endocrine: Negative.    Hematologic/Lymphatic: Negative.    Skin: Negative.    Musculoskeletal: Negative.    Gastrointestinal: Negative.    Genitourinary: Negative.    Neurological: Negative.    Psychiatric/Behavioral: Negative.     Allergic/Immunologic: Negative.        Physical Exam    Physical Exam   General Appearance: no distress   Skin: warm, no ulcers or xanthomas   Digits and Nails: no cyanosis or clubbing   Eyes: conjunctivae and lids normal, pupils are equal and round   Teeth/Gums/Palate: dentition unremarkable, no lesions   Lips & Oral Mucosa: no pallor or cyanosis   Neck Veins: normal JVP , neck veins are not distended   Thyroid: no nodules, masses, tenderness or enlargement   Chest Inspection: chest is normal in appearance   Respiratory Effort: breathing comfortably, no respiratory distress   Auscultation/Percussion: lungs clear to auscultation, no rales or rhonchi, no wheezing   PMI: PMI not enlarged or displaced   Cardiac Rhythm: regular rhythm and normal rate   Cardiac Auscultation: S1, S2 normal, no rub, no gallop   Murmurs: no murmur   Peripheral Circulation: normal peripheral circulation   Carotid Arteries: normal carotid upstroke bilaterally, no bruits   Radial Arteries: normal symmetric radial pulses   Abdominal Aorta: no abdominal aortic bruit   Pedal Pulses: normal symmetric pedal pulses   Lower Extremity Edema: no lower extremity edema   Abdominal Exam: soft, non-tender, no masses, bowel sounds normal   Liver & Spleen: no organomegaly   Gait & Station: walks without assistance   Muscle Strength: normal muscle tone   Orientation: oriented to time, place and person   Affect & Mood: appropriate and sustained affect   Language and Memory: patient responsive and seems to comprehend information   Neurologic Exam: neurological assessment grossly intact   Other: moves all extremities      Cardiovascular Studies    EKG:  SR, rate 62.      Cardiovascular Health Factors  Vitals BP Readings from Last 3 Encounters:   10/22/23 (!) 159/90   08/12/23 109/63   05/06/23 91/53     Wt Readings from Last 3 Encounters:   10/22/23 96.8 kg (213 lb 6.4 oz)   08/12/23 93.4 kg (206 lb)   05/29/23 93.9 kg (207 lb)     BMI Readings from Last 3 Encounters:   10/22/23 28.94 kg/m?   08/12/23 27.94 kg/m?   05/29/23 28.07 kg/m?      Smoking  Tobacco Use History[1]   Lipid Profile Cholesterol   Date Value Ref Range Status   01/23/2023 175 <200 mg/dL Final     HDL   Date Value Ref Range Status   01/23/2023 40 >=40 mg/dL Final     LDL   Date Value Ref Range Status   01/23/2023 117 <100 mg/dL Final     Triglycerides   Date Value Ref Range Status   01/23/2023 91 <150 mg/dL Final      Blood Sugar Hemoglobin A1C   Date Value Ref Range Status   08/09/2020 9.0 (H) <5.7 Final     Glucose   Date Value Ref Range Status   09/09/2023 206 (H) 70 - 105 Final   03/21/2023 225 (H)  Final   02/20/2023 140  Final     Glucose, POC   Date Value Ref Range Status   08/12/2023 242 (A) 70 - 100 Final   05/06/2023 385 (A) 70 - 100 Final   03/27/2023 213 (H) 70 - 100 mg/dL Final          Problems Addressed Today  Encounter Diagnoses   Name Primary?    Screening for heart disease Yes    Primary idiopathic dilated cardiomyopathy (CMS-HCC)     Coronary artery disease due to calcified coronary lesion     Mixed hyperlipidemia     Primary hypertension     Diabetes mellitus type 2, insulin  dependent (CMS-HCC)        Assessment and Plan       Coronary artery disease due to calcified coronary lesion  CTO stenting of RCA in January, 2025, after presenting with chest pain and RCA-distribution ischemia on MPI.  EF 41% on MPI.    I discussed today's presentation with Dr. Emilia and we agreed that it would be appropriate to schedule coronary arteriography with possible PCI if indicated.    Primary idiopathic dilated cardiomyopathy (CMS-HCC)  EF was 41% on the ischemic MPI from last November.  Echo 02/25/23 showed EF 50-55%, sclerotic AV.    Hyperlipidemia  Lab Results   Component Value Date    CHOL 175 01/23/2023    TRIG 91 01/23/2023    HDL 40 01/23/2023    LDL 117 01/23/2023    VLDL 18 01/23/2023    CHOLHDLC 4 01/23/2023      Atorvastatin 40 mg/day    Primary hypertension  Amlodipine 10/d, lisinopril 20/d, carvedilol 12.5 BID.    Diabetes mellitus type 2, insulin  dependent (CMS-HCC)  A1c was 10.1% in mid-June, 2025.  Metformin  1,000/d, Farxiga 10 mg/day, and insulin  pump.  He's managed at the [facility].  The insulin  pump was started subsequent to that last A1c.      Current Medications (including today's revisions)   acyclovir (ZOVIRAX) 400 mg tablet Take one tablet by mouth every 24 hours.    amLODIPine (NORVASC) 10 mg tablet Take one tablet by mouth daily.    aspirin (ASPIRIN CHILDRENS) 81 mg chewable tablet Chew one tablet by mouth daily. Indications: treatment to prevent peripheral artery thromboembolism    atorvastatin (LIPITOR) 40 mg tablet Take one tablet by mouth at bedtime daily.    baclofen 5 mg tablet Take one tablet by mouth three times daily.    carvediloL (COREG) 12.5 mg tablet Take one tablet by mouth twice daily with meals. Take with food.    cholecalciferol (VITAMIN D -3) 1,000 units tablet Take two tablets by mouth daily.    clopiDOGreL (PLAVIX) 75 mg tablet Take one  tablet by mouth daily. Indications: blood clot prevention following percutaneous coronary intervention    dapagliflozin propanediol (FARXIGA) 10 mg tablet Take one tablet by mouth daily.    DEXCOM G7 SENSOR sensor device Use one each as directed every 10 days. Indications: type 2 diabetes mellitus    divalproex (DEPAKOTE ER) 500 mg ER tablet Take one tablet by mouth twice daily. Take with food.    ergocalciferol  (vitamin D2) (VITAMIN D2) 1,250 mcg (50,000 unit) capsule Take one capsule by mouth every 7 days.    escitalopram  oxalate (LEXAPRO ) 20 mg tablet TAKE ONE TABLET BY MOUTH EVERY DAY    gabapentin (NEURONTIN) 600 mg tablet Take one tablet by mouth twice daily.    glucagon  (GVOKE HYPOPEN  2-PACK) 1 mg/0.2 mL syringe Inject 0.2 mL under the skin as Needed. Inject 1 mg into the upper arm, thigh, or buttocks if needed for severe hypoglycemia and then call 911.    HYDROcodone/acetaminophen(+) (NORCO) 10/325 mg tablet Take one tablet by mouth at bedtime daily.    insulin  lispro (HUMALOG  U-100 INSULIN ) 100 unit/mL injection Inject one hundred Units under the skin daily. Up to 100 units per day using insulin  pump. E11.9  Indications: type 2 diabetes mellitus    insulin  pump cart,auto,BT,G6/7 (OMNIPOD 5 G6-G7 PODS (GEN 5)) cartridge Inject one each under the skin every 72 hours.    insulin  pump cart,auto,BT-cntr (OMNIPOD 5 G6 INTRO KIT (GEN 5)) cartridge Inject one each under the skin every 48 hours.    linaCLOtide  (LINZESS ) 145 mcg capsule Take one capsule by mouth daily 30 minutes before breakfast. Indications: chronic idiopathic constipation    linaCLOtide  (LINZESS ) 145 mcg capsule Take one capsule by mouth daily 30 minutes before breakfast.    lisinopril (PRINIVIL; ZESTRIL) 20 mg tablet Take 1 Tab by mouth daily.    LORazepam  (ATIVAN ) 1 mg tablet Take 1 mg when nausea begins    magnesium oxide (MAGOX) 400 mg (241.3 mg magnesium) tablet Take one tablet by mouth twice daily.    metFORMIN -XR (GLUCOPHAGE  XR) 500 mg extended release tablet Take two tablets by mouth daily with dinner. Indications: type 2 diabetes mellitus    ondansetron (ZOFRAN ODT) 4 mg rapid dissolve tablet Dissolve  by mouth as Needed.    oxyCODONE 10 mg tablet Take one tablet by mouth three times daily as needed.    pantoprazole DR (PROTONIX) 40 mg tablet Take one tablet by mouth daily.    promethazine  (PHENERGAN ) 25 mg tablet Take one tablet by mouth every 6 hours as needed for Nausea or Vomiting.    zinc  acetate (GALZIN) 50 mg (zinc ) oral capsule Take one capsule by mouth daily.     Total time spent on today's office visit was 45 minutes.  This includes face-to-face in person visit with patient as well as nonface-to-face time including review of the EMR, outside records, labs, radiologic studies, echocardiogram & other cardiovascular studies, formation of treatment plan, after visit summary, future disposition, and lastly on documentation.              [1]   Social History  Tobacco Use   Smoking Status Never   Smokeless Tobacco Never

## 2023-10-22 NOTE — Assessment & Plan Note
 Amlodipine 10/d, lisinopril 20/d, carvedilol 12.5 BID.

## 2023-10-24 ENCOUNTER — Encounter: Admit: 2023-10-24 | Discharge: 2023-10-24 | Payer: MEDICARE

## 2023-10-24 NOTE — Patient Instructions
 CARDIAC CATHETERIZATION   PRE-ADMISSION INSTRUCTIONS    Patient Name: Karl Knapp  MRN#: [id number]  Date of Birth: 10/13/1950 (73 y.o.)  Today's Date: 10/24/2023    PROCEDURE:  You are scheduled for a Coronary Angiogram with possible Angioplasty/Stenting with Dr. Curtistine Fallow.    PROCEDURE DATE AND ARRIVAL TIME:  Your procedure date is 11/12/23.  You will receive a call from the Cath lab staff between 8:00 a.m. and noon on the business day prior to your procedure to let you know at what time to arrive on the day of your procedure.    Please check in at the Admitting Desk in the [facility] for your procedure.   Address:  [address],   [place], [place] [address]    [facility] Entrance and take a right. Continue down the hallway past the Cardiovascular Medicine office. That hall will take [facility] into the [facility]. Check in at the desk on the left side.)     (If you have further questions regarding your arrival time for the CV lab, please call [phone] by 3:00pm the day before your procedure. Please leave a message with your name and number, your call will be returned in a timely manner.)    PRE-PROCEDURE APPOINTMENTS:  To be done 8/26-9/8   Pre-Admission lab work required within 14 days of procedure: BMP and CBC at the lab of your choice.         FOOD AND DRINK INSTRUCTIONS  If not taking oral or injectable GLP-1 medication: Nothing to eat after 11p.m. the evening before your procedure. Take your prescription medications with a sip of water as instructed. No caffeine for 24 hours prior to your procedure. You will be under moderate sedation for your procedure.  You may drink clear liquids up to an hour before hospital arrival. This will be confirmed by the Cath lab staff the day before your procedure.     SPECIAL MEDICATION INSTRUCTIONS  Any new prescriptions will be sent to your pharmacy listed on file with us .     Is the patient taking oral anticoagulation medication (NOAC, DOAC, or Warfarin)?: No    Does the ordering provider want a Lovenox bridge before/after cath? N/A    TAKE AM OF PROCEDURE:  Please take either 4 baby aspirin (4 x 81mg ) or one full strength NON-COATED 325mg  aspirin.  In addition to the aspirin, if you take Clopidogrel (Plavix), Prasugrel (Effient), or Ticagrelor (Brilinta), please take your scheduled dose the morning of the procedure.  TAKE ALL other medications not discussed above as prescribed. If you are prescribed new medications prior to your procedure or are taking medications that aren't on the list below, please let us  know so we can review prior to your procedure.     Hypoglycemics: metformin  (Glucophage ) -- hold the morning of your procedure.  and dapagliflozin Pauletta) -- hold the morning of your procedure.   Insulin : NO INSULIN  AM OF PROCEDURE/Insulin  pumps continue at basal rate no bolus AM of procedure    HOLD ALL erectile dysfunction medications for 3 days, unless prescribed for pulmonary hypertension.  HOLD ALL over the counter vitamins or supplements on the morning of your procedure.      Additional Instructions  If you wear CPAP, please bring your mask and machine with you to the hospital.    Take a bath or shower with anti-bacterial soap the evening before, or the morning of the procedure.     Bring photo ID and your health insurance  card(s).    Arrange for a driver to take you home from the hospital. Please arrange for a friend or family member to take you home from this test. You cannot take a Taxi, [place], or public transportation as there has to be a responsible person to help care for you after sedation    Bring an accurate list of your current medications with you to the hospital (all medications and supplements taken daily).  Please use the medication list below and write in the date and time when you took your last dose before your procedure. Update this list of medications as needed.      Wear comfortable clothes and don't bring valuables, other than photo identification card, with you to the hospital.    Please pack a bag for an overnight stay.     For patients who are staying overnight on the Cardiovascular Treatment and Recovery Unit (CTR), no visitor(s) will be allowed to sleep at the bedside.    Please review your pre-procedure instructions and bring them with you on the day of your procedure.  Call the office at [phone] with any questions. You may ask to speak with Dr. Elspeth Doles nurse.      ALLERGIES  Allergies   Allergen Reactions    Morphine RASH    Sulfa (Sulfonamide Antibiotics) HIVES    Fentanyl AGITATION       CURRENT MEDICATIONS  Outpatient Encounter Medications as of 10/24/2023   Medication Sig Dispense Refill    acyclovir (ZOVIRAX) 400 mg tablet Take one tablet by mouth every 24 hours.      amLODIPine (NORVASC) 10 mg tablet Take one tablet by mouth daily.      aspirin (ASPIRIN CHILDRENS) 81 mg chewable tablet Chew one tablet by mouth daily. Indications: treatment to prevent peripheral artery thromboembolism      atorvastatin (LIPITOR) 40 mg tablet Take one tablet by mouth at bedtime daily. 90 tablet 3    baclofen 5 mg tablet Take one tablet by mouth three times daily.      carvediloL (COREG) 12.5 mg tablet Take one tablet by mouth twice daily with meals. Take with food.      cholecalciferol (VITAMIN D -3) 1,000 units tablet Take two tablets by mouth daily.      clopiDOGreL (PLAVIX) 75 mg tablet Take one tablet by mouth daily. Indications: blood clot prevention following percutaneous coronary intervention 90 tablet 3    dapagliflozin propanediol (FARXIGA) 10 mg tablet Take one tablet by mouth daily. 90 tablet 3    DEXCOM G7 SENSOR sensor device Use one each as directed every 10 days. Indications: type 2 diabetes mellitus 9 each 3    divalproex (DEPAKOTE ER) 500 mg ER tablet Take one tablet by mouth twice daily. Take with food.      ergocalciferol  (vitamin D2) (VITAMIN D2) 1,250 mcg (50,000 unit) capsule Take one capsule by mouth every 7 days. 12 capsule 0    escitalopram  oxalate (LEXAPRO ) 20 mg tablet TAKE ONE TABLET BY MOUTH EVERY DAY 90 tablet 0    gabapentin (NEURONTIN) 600 mg tablet Take one tablet by mouth twice daily.      glucagon  (GVOKE HYPOPEN  2-PACK) 1 mg/0.2 mL syringe Inject 0.2 mL under the skin as Needed. Inject 1 mg into the upper arm, thigh, or buttocks if needed for severe hypoglycemia and then call 911. 0.4 mL 1    HYDROcodone/acetaminophen(+) (NORCO) 10/325 mg tablet Take one tablet by mouth at bedtime daily.  insulin  lispro (HUMALOG  U-100 INSULIN ) 100 unit/mL injection Inject one hundred Units under the skin daily. Up to 100 units per day using insulin  pump. E11.9  Indications: type 2 diabetes mellitus 100 mL 1    insulin  pump cart,auto,BT,G6/7 (OMNIPOD 5 G6-G7 PODS (GEN 5)) cartridge Inject one each under the skin every 72 hours. 30 each 3    insulin  pump cart,auto,BT-cntr (OMNIPOD 5 G6 INTRO KIT (GEN 5)) cartridge Inject one each under the skin every 48 hours. 10 each 0    linaCLOtide  (LINZESS ) 145 mcg capsule Take one capsule by mouth daily 30 minutes before breakfast. Indications: chronic idiopathic constipation 16 capsule 0    linaCLOtide  (LINZESS ) 145 mcg capsule Take one capsule by mouth daily 30 minutes before breakfast. 90 capsule 1    lisinopril (PRINIVIL; ZESTRIL) 20 mg tablet Take 1 Tab by mouth daily. 90 Tab 3    LORazepam  (ATIVAN ) 1 mg tablet Take 1 mg when nausea begins 30 tablet 0    magnesium oxide (MAGOX) 400 mg (241.3 mg magnesium) tablet Take one tablet by mouth twice daily.      metFORMIN -XR (GLUCOPHAGE  XR) 500 mg extended release tablet Take two tablets by mouth daily with dinner. Indications: type 2 diabetes mellitus 180 tablet 3    ondansetron (ZOFRAN ODT) 4 mg rapid dissolve tablet Dissolve  by mouth as Needed.      oxyCODONE 10 mg tablet Take one tablet by mouth three times daily as needed.      pantoprazole DR (PROTONIX) 40 mg tablet Take one tablet by mouth daily.      promethazine  (PHENERGAN ) 25 mg tablet Take one tablet by mouth every 6 hours as needed for Nausea or Vomiting. 60 tablet 1    zinc  acetate (GALZIN) 50 mg (zinc ) oral capsule Take one capsule by mouth daily.       No facility-administered encounter medications on file as of 10/24/2023.       _________________________________________  Form completed by: Tawni Forge, RN  Date completed: 10/24/23  Method: Via telephone and sent to MyChart.

## 2023-10-25 ENCOUNTER — Encounter: Admit: 2023-10-25 | Discharge: 2023-10-25 | Payer: MEDICARE

## 2023-10-25 MED ORDER — METFORMIN 500 MG PO TB24
1000 mg | ORAL_TABLET | Freq: Every day | ORAL | 2 refills | 90.00000 days | Status: AC
Start: 2023-10-25 — End: ?

## 2023-10-25 NOTE — Telephone Encounter
 Received by fax. Refilled per protocol.

## 2023-10-25 NOTE — Telephone Encounter
 Attempted to call pt to discuss procedural instructions.  No answer.  I did leave detailed message(per permission to communicate) with callback number to call us  back to verify received information.  I also sent instructions via MyChart.

## 2023-10-28 ENCOUNTER — Encounter: Admit: 2023-10-28 | Discharge: 2023-10-28 | Payer: MEDICARE

## 2023-10-28 NOTE — Progress Notes
 10/28/2023 7:17 AM   Medicare is listed as patient's primary insurance coverage.  Pre-certification is not required for hospitalizations.

## 2023-11-12 ENCOUNTER — Encounter: Admit: 2023-11-12 | Discharge: 2023-11-12 | Payer: MEDICARE

## 2023-12-09 ENCOUNTER — Encounter: Admit: 2023-12-09 | Discharge: 2023-12-09 | Payer: MEDICARE

## 2023-12-09 ENCOUNTER — Ambulatory Visit: Admit: 2023-12-09 | Discharge: 2023-12-10 | Payer: MEDICARE

## 2023-12-12 ENCOUNTER — Encounter: Admit: 2023-12-12 | Discharge: 2023-12-12 | Payer: MEDICARE

## 2024-01-10 ENCOUNTER — Encounter: Admit: 2024-01-10 | Discharge: 2024-01-10 | Payer: MEDICARE

## 2024-02-03 ENCOUNTER — Encounter: Admit: 2024-02-03 | Discharge: 2024-02-03 | Payer: MEDICARE

## 2024-02-03 DIAGNOSIS — E119 Type 2 diabetes mellitus without complications: Principal | ICD-10-CM

## 2024-02-03 MED ORDER — INSULIN LISPRO 100 UNIT/ML SC SOLN
135 [IU] | Freq: Every day | SUBCUTANEOUS | 3 refills | 30.00000 days | Status: AC
Start: 2024-02-03 — End: ?

## 2024-02-03 NOTE — Telephone Encounter [36]
 Received voicemail from patient  Patient is going through more insulin  asking for new script allowing for 2-3 more vials per 90 day supply    Pump report pulled  Routing to provider

## 2024-02-03 NOTE — Telephone Encounter [36]
 Pt's Humalog  RX updated to reflect most recent TDD needs and primed insulin  for tubing.

## 2024-02-04 NOTE — Assessment & Plan Note [38]
 Echo 02/2023 showed EF 50%.

## 2024-02-04 NOTE — Assessment & Plan Note [38]
 CTO stenting of RCA in January, 2025, after presenting with chest pain and RCA-distribution ischemia on MPI. EF 41% on MPI.     11/12/23 - Cath for recurrent symptoms showed 80% RPD, unchanged from prior cath.  Prior stent in proximal RCA widely patent.

## 2024-02-04 NOTE — Assessment & Plan Note [38]
 Managed at [facility].  A1c 8.3% October, 2025.

## 2024-02-05 ENCOUNTER — Encounter: Admit: 2024-02-05 | Discharge: 2024-02-05 | Payer: MEDICARE

## 2024-02-05 ENCOUNTER — Ambulatory Visit: Admit: 2024-02-05 | Discharge: 2024-02-06 | Payer: MEDICARE

## 2024-02-05 VITALS — BP 117/63 | HR 64 | Ht 72.0 in | Wt 232.2 lb

## 2024-02-05 DIAGNOSIS — G4733 Obstructive sleep apnea (adult) (pediatric): Secondary | ICD-10-CM

## 2024-02-05 DIAGNOSIS — I63232 Cerebral infarction due to unspecified occlusion or stenosis of left carotid arteries: Secondary | ICD-10-CM

## 2024-02-05 DIAGNOSIS — E782 Mixed hyperlipidemia: Secondary | ICD-10-CM

## 2024-02-05 DIAGNOSIS — I2510 Atherosclerotic heart disease of native coronary artery without angina pectoris: Principal | ICD-10-CM

## 2024-02-05 DIAGNOSIS — Z8673 Personal history of transient ischemic attack (TIA), and cerebral infarction without residual deficits: Secondary | ICD-10-CM

## 2024-02-05 DIAGNOSIS — I6529 Occlusion and stenosis of unspecified carotid artery: Secondary | ICD-10-CM

## 2024-02-05 DIAGNOSIS — I10 Essential (primary) hypertension: Secondary | ICD-10-CM

## 2024-02-05 DIAGNOSIS — I420 Dilated cardiomyopathy: Secondary | ICD-10-CM

## 2024-02-05 DIAGNOSIS — E119 Type 2 diabetes mellitus without complications: Secondary | ICD-10-CM

## 2024-02-05 MED ORDER — EZETIMIBE 10 MG PO TAB
10 mg | ORAL_TABLET | Freq: Every day | ORAL | 3 refills | 90.00000 days | Status: AC
Start: 2024-02-05 — End: ?

## 2024-02-05 NOTE — Assessment & Plan Note [38]
 He has an appointment with Dr. Quenten later in the month for evaluation of sleep apnea.

## 2024-02-05 NOTE — Progress Notes [1]
 Date of Service: 02/05/2024    Karl Knapp is a 73 y.o. male.       HPI     Karl Knapp was in the OP clinic today for a work-in visit regarding recent stroke.  He was accompanied by his wife and daughter, who is a engineer, civil (consulting).      On November 1 Rage developed stroke symptoms including expressive aphasia and right-sided weakness.  He was transported to [facility] where he remained hospitalized for about 4 days.  He had a good work-up at that time that included CT-head, CTA-neck, and MR-head.  He also had TTE.  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  He was found to have high-grade right vertebral stenosis, but I don't think that contributed to the presentation last month.    They switched him from atorvastatin to rosuvastatin during the hospitalization last month.      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  He initially had a lot of difficulty with heart failure manifestations but eventually this all settled down and his ejection fraction improved to the range of 45 to 50% on GDMT.      He has diabetes and is managed by the [facility] at [facility].  He has CAD and underwent CTO DES to an occluded RCA in 2024.     There's a history of sleep apnea, but he discontinued treatment after significant weight loss.  His family definitely thinks he has apnea and he does complain of a lot of daytime fatigue.  He has an appointment with Dr. Quenten later this month for evaluation for sleep apnea.       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  Distal RPD 85% (tortuous vessel).  Risk factor management recommended.    02/25/23 Cath: Successful IVUS-guided percutaneous coronary intervention of the proximal to mid RCA CTO with 4 mm x 48 mm Synergy drug-eluting stent, which was postdilated with a 4 mm x 21 mm noncompliant balloon at 18 atmospheres. Successful IVUS-guided percutaneous coronary intervention of the distal RCA with a 3.5 mm x 26 mm Onyx Frontier drug-eluting stent, which was postdilated with a 4 mm x 15 mm noncompliant balloon at 18 atmospheres. Successful closure of the right common femoral artery with 8-French Angio-Seal closure device. Significant mid left anterior descending artery lesion of 80% stenosis. Significant mid PDA lesion of 90% stenosis noted.    03/27/23 Cath: Significant native coronary artery disease seen in the left anterior descending artery, which has 80% stenosis. The patent stent seen in the  proximal and distal portion of the right coronary artery. Significant 90% stenosis seen in the posterior descending artery. Successful IVUS-guided percutaneous coronary intervention of the mid left anterior descending artery with a 2.5 mm x 18 mm Onyx Frontier drug-eluting stent, which was postdilated with a 2.75 mm x 15 mm noncompliant balloon at 16 atmospheres in the proximal and the distal portion.    11/12/23 Cath: 80% stenosis of the right posterior descending artery branches unchanged from the prior catheterization. Prior patent stent in the proximal right coronary artery, mid-to-distal right coronary artery, and mid left anterior descending. Mild-to-moderate nonobstructive coronary artery disease of the mid right coronary artery and ostium of the first marginal branch and proximal left anterior descending. Normal left ventricular end-diastolic pressure. No significant gradient across the aortic valve on pullback method.      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  No AAA.      Screening for cardiovascular condition 03/28/2010    Erectile dysfunction 02/23/2009    Diabetes mellitus type 2, insulin  dependent (CMS-HCC) 02/22/2009    Arthritis 02/22/2009    History of back surgery 02/22/2009     Removed discs w/fusion 5/10      Atypical chest pain 01/26/2009       4/00 - Cardiac cath: normal coronaries, EF 40 to 45%.         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          1989, 4/00 - Normal coronary arteries by catheterization.          1989 - RV biopsy demonstrating mild interstitial fibrosis and myocyte hypertrophy,                     no acute myocarditis.          Treatment with ACE inhibitor, digoxin - EF 40% - Class 2 CHF symptoms.          3/03 - EF 50-55% per echo.          6/05 - EXED: EF 45%, mild LAE, mild MR and TR, non-ischemic.      02/25/23 Echo: EF 50 to 55%.  Biplane EF 52%. No focal wall motion abnormalities. LVIDD 4.3 cm.  LV diastolic volume index 39 mL/m?SABRA  Aortic valve sclerosis without stenosis. No clinically significant valve disease detected.  The IVC was poorly seen.  Cannot estimate CVP.      Hyperlipidemia 01/26/2009     1999 - Zocor initiated.          6/05 - Vytorin initiated.      Sleep apnea, obstructive 01/26/2009      8/01 - Sleep study at [facility]. CPAP therapy recommended.          Intolerant of CPAP--insurance company denied heated humidifier.          8/08 -  Positive sleep study at [facility], nasal CPAP started with good result      Anxiety 01/26/2009       Review of Systems Constitutional: Negative.   HENT: Negative.     Eyes: Negative.    Cardiovascular: Negative.    Respiratory: Negative.     Endocrine: Negative.    Hematologic/Lymphatic: Negative.    Skin: Negative.    Musculoskeletal: Negative.    Gastrointestinal: Negative.  Genitourinary: Negative.    Neurological: Negative.    Psychiatric/Behavioral: Negative.     Allergic/Immunologic: Negative.        Physical Exam    Physical Exam   General Appearance: no distress   Skin: warm, no ulcers or xanthomas   Digits and Nails: no cyanosis or clubbing   Eyes: conjunctivae and lids normal, pupils are equal and round   Teeth/Gums/Palate: dentition unremarkable, no lesions   Lips & Oral Mucosa: no pallor or cyanosis   Neck Veins: normal JVP , neck veins are not distended   Thyroid: no nodules, masses, tenderness or enlargement   Chest Inspection: chest is normal in appearance   Respiratory Effort: breathing comfortably, no respiratory distress   Auscultation/Percussion: lungs clear to auscultation, no rales or rhonchi, no wheezing   PMI: PMI not enlarged or displaced   Cardiac Rhythm: regular rhythm and normal rate   Cardiac Auscultation: S1, S2 normal, no rub, no gallop   Murmurs: no murmur   Peripheral Circulation: normal peripheral circulation   Carotid Arteries: normal carotid upstroke bilaterally, no bruits   Radial Arteries: normal symmetric radial pulses   Abdominal Aorta: no abdominal aortic bruit   Pedal Pulses: normal symmetric pedal pulses   Lower Extremity Edema: no lower extremity edema   Abdominal Exam: soft, non-tender, no masses, bowel sounds normal   Liver & Spleen: no organomegaly   Gait & Station: walks without assistance   Muscle Strength: normal muscle tone   Orientation: oriented to time, place and person   Affect & Mood: appropriate and sustained affect   Language and Memory: patient responsive and seems to comprehend information   Neurologic Exam: neurological assessment grossly intact   Other: moves all extremities        Cardiovascular Health Factors  Vitals BP Readings from Last 3 Encounters:   02/05/24 117/63   12/09/23 127/68   11/12/23 (!) 159/79     Wt Readings from Last 3 Encounters:   02/05/24 105.3 kg (232 lb 3.2 oz)   12/09/23 102.1 kg (225 lb)   11/12/23 96.7 kg (213 lb 3 oz)     BMI Readings from Last 3 Encounters:   02/05/24 31.49 kg/m?   12/09/23 30.52 kg/m?   11/12/23 28.91 kg/m?      Smoking Tobacco Use History[1]   Lipid Profile Cholesterol   Date Value Ref Range Status   11/12/2023 157 <200 mg/dL Final     HDL   Date Value Ref Range Status   11/12/2023 47 >40 mg/dL Final     LDL   Date Value Ref Range Status   11/12/2023 86 <100 mg/dL Final     Triglycerides   Date Value Ref Range Status   11/12/2023 143 <150 mg/dL Final      Blood Sugar Hemoglobin A1C   Date Value Ref Range Status   08/09/2020 9.0 (H) <5.7 Final     Glucose   Date Value Ref Range Status   11/12/2023 108 (H) 70 - 100 mg/dL Final   92/92/7974 793 (H) 70 - 105 Final   03/21/2023 225 (H)  Final     Glucose, POC   Date Value Ref Range Status   11/12/2023 111 (H) 70 - 100 mg/dL Final   90/90/7974 96 70 - 100 mg/dL Final   90/90/7974 885 (H) 70 - 100 mg/dL Final         Problems Addressed Today  Encounter Diagnoses   Name Primary?    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 EF 41% on MPI.     11/12/23 - Cath for recurrent symptoms showed 80% RPD, unchanged from prior cath.  Prior stent in proximal RCA widely patent.    Primary idiopathic dilated cardiomyopathy (CMS-HCC)  Echo 02/2023 showed EF 50%.    Hyperlipidemia  Lab Results   Component Value Date    CHOL 157 11/12/2023    TRIG 143 11/12/2023    HDL 47 11/12/2023    LDL 86 11/12/2023    VLDL 71.3 11/12/2023    NONHDLCHOL 110 11/12/2023    CHOLHDLC 4 01/23/2023      Rosuvastatin 40/d and add Zetia  10 mg/d.  Lipid profile in late January.  Given the acute stroke the LDL goal is < 55.    Primary hypertension  Amlodipine 10/d, carvedilol 12.5 BID.  He was taken off lisinopril due to low BP after the stroke.  He's going to check home BP and I'd like to get him back on some ACE, given his diabetes diagnosis.  I'd reduce amlodipine, if necessary, to get him back on lisinopril.    Diabetes mellitus type 2, insulin  dependent (CMS-HCC)  Managed at [facility].  A1c 8.3% October, 2025.    Sleep apnea, obstructive  He has an appointment with Dr. Quenten later in the month for evaluation of sleep apnea.    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  I will intensify cholesterol-lowering therapy and we'll be sure that we have optimal BP.  It sounds like the [facility] is working effectively to manage his diabetes.    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  I don't think they'll recommend CEA, but I'd just like them to take a look at his situation and make any comments regarding his management.    We'll arrange for two 2-week Zio monitors (total of 4 weeks) to rule out PAF.    Current Medications (including today's revisions)   acyclovir (ZOVIRAX) 400 mg tablet Take one tablet by mouth every 24 hours.    amLODIPine (NORVASC) 10 mg tablet Take one tablet by mouth daily.    aspirin (ASPIRIN CHILDRENS) 81 mg chewable tablet Chew one tablet by mouth daily. Indications: treatment to prevent peripheral artery thromboembolism    atorvastatin (LIPITOR) 80 mg tablet Take one tablet by mouth at bedtime daily. (Patient not taking: Reported on 02/05/2024)    baclofen 5 mg tablet Take one tablet by mouth three times daily.    carvediloL (COREG) 12.5 mg tablet Take one tablet by mouth twice daily with meals. Take with food.    cholecalciferol (VITAMIN D -3) 1,000 units tablet Take  two tablets by mouth daily.    clopiDOGreL (PLAVIX) 75 mg tablet Take one tablet by mouth daily. Indications: blood clot prevention following percutaneous coronary intervention    dapagliflozin propanediol (FARXIGA) 10 mg tablet Take one tablet by mouth daily.    DEXCOM G7 SENSOR sensor device Use one each as directed every 10 days. Indications: type 2 diabetes mellitus    divalproex (DEPAKOTE ER) 500 mg ER tablet Take one tablet by mouth twice daily. Take with food.    ergocalciferol  (vitamin D2) (VITAMIN D2) 1,250 mcg (50,000 unit) capsule Take one capsule by mouth every 7 days.    escitalopram  oxalate (LEXAPRO ) 20 mg tablet TAKE ONE TABLET BY MOUTH EVERY DAY    ezetimibe  (ZETIA ) 10 mg tablet Take one tablet by mouth daily.    gabapentin (NEURONTIN) 600 mg tablet Take one tablet by mouth twice daily.    glucagon  (GVOKE HYPOPEN  2-PACK) 1 mg/0.2 mL syringe Inject 0.2 mL under the skin as Needed. Inject 1 mg into the upper arm, thigh, or buttocks if needed for severe hypoglycemia and then call 911.    HYDROcodone/acetaminophen(+) (NORCO) 10/325 mg tablet Take one tablet by mouth at bedtime daily.    insulin  glargine (LANTUS  SOLOSTAR U-100 INSULIN ) 100 unit/mL (3 mL) subcutaneous PEN Inject forty Units under the skin at bedtime daily. FOR INSULIN  PUMP FAILURE BACK UP PLAN    insulin  lispro (HUMALOG  U-100 INSULIN ) 100 unit/mL injection Inject one hundred thirty five Units under the skin daily. Up to 135 units per day using insulin  pump. E11.9  Indications: type 2 diabetes mellitus    lisinopril (PRINIVIL; ZESTRIL) 20 mg tablet Take 1 Tab by mouth daily.    LORazepam  (ATIVAN ) 1 mg tablet Take 1 mg when nausea begins    magnesium oxide (MAGOX) 400 mg (241.3 mg magnesium) tablet Take one tablet by mouth twice daily.    metFORMIN -XR (GLUCOPHAGE  XR) 500 mg extended release tablet Take two tablets by mouth daily with dinner. Indications: type 2 diabetes mellitus    ondansetron (ZOFRAN ODT) 4 mg rapid dissolve tablet Dissolve  by mouth as Needed.    oxyCODONE 10 mg tablet Take one tablet by mouth three times daily as needed.    pantoprazole DR (PROTONIX) 40 mg tablet Take one tablet by mouth daily.    promethazine  (PHENERGAN ) 25 mg tablet Take one tablet by mouth every 6 hours as needed for Nausea or Vomiting.    rosuvastatin (CRESTOR) 40 mg tablet Take one tablet by mouth at bedtime daily.    zinc  acetate (GALZIN) 50 mg (zinc ) oral capsule Take one capsule by mouth daily.     Total time spent on today's office visit was 45 minutes.  This includes face-to-face in person visit with patient as well as nonface-to-face time including review of the EMR, outside records, labs, radiologic studies, echocardiogram & other cardiovascular studies, formation of treatment plan, after visit summary, future disposition, and lastly on documentation.              [1]   Social History  Tobacco Use   Smoking Status Never   Smokeless Tobacco Never

## 2024-02-06 ENCOUNTER — Encounter: Admit: 2024-02-06 | Discharge: 2024-02-06 | Payer: MEDICARE

## 2024-02-06 DIAGNOSIS — R55 Syncope and collapse: Secondary | ICD-10-CM

## 2024-02-06 DIAGNOSIS — R0789 Other chest pain: Secondary | ICD-10-CM

## 2024-02-06 DIAGNOSIS — I420 Dilated cardiomyopathy: Secondary | ICD-10-CM

## 2024-02-06 DIAGNOSIS — I63232 Cerebral infarction due to unspecified occlusion or stenosis of left carotid arteries: Principal | ICD-10-CM

## 2024-02-06 DIAGNOSIS — I10 Essential (primary) hypertension: Secondary | ICD-10-CM

## 2024-02-06 DIAGNOSIS — I480 Paroxysmal atrial fibrillation: Secondary | ICD-10-CM

## 2024-02-06 NOTE — Telephone Encounter [36]
-----   Message from GORMAN Balloon, MD sent at 02/05/2024  6:00 PM CST -----  Regarding: 2 two-week Zio monitors?  His visit today was a little overwhelming and I forgot to set him up with a total of 4 weeks of Zio-Patch monitoring to rule out PAF as the cause of his stroke last month.  Do you mind contacting him and getting this set up at some point?  Thanks a lot!

## 2024-02-07 ENCOUNTER — Ambulatory Visit: Admit: 2024-02-07 | Discharge: 2024-02-07 | Payer: MEDICARE

## 2024-02-07 ENCOUNTER — Encounter: Admit: 2024-02-07 | Discharge: 2024-02-07 | Payer: MEDICARE

## 2024-02-07 NOTE — Progress Notes [1]
 To our valued patient,     We have enrolled your heart monitor and requested it be sent to your home.  You should receive this within 2-3 business days. Please wear the monitor for 28 days. When you have completed the study, please remove the device, and mail it back to the company. Please call [employer] Customer Service at [phone] with questions about placement, troubleshooting, and insurance coverage. You can reach the [facility] ambulatory heart monitor team at [phone].      The Zio AT monitor has a duration limit of 14 days. In accordance with the standard protocol, [employer], the provider of the monitor, will be sending a 2 device for continued monitoring.    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 [place] [address]  Insurance Coverage: MEDICARE PART A AND B  Insurance ID: [id number]  Insurance Group #:   Insurance Subscriber: Coin,Jamiah DEAN  Implanted Cardiac Device Information: No results found for: [id number]      Patient instructed to contact company phone number on the monitor box with questions regarding billing, placement, troubleshooting.     Shanda Clause    ____________________________________________________________    Clinic Staff:    Complete additional steps for documentation double check/Co-Sign.  In Follow-up, send chart upon closing encounter to [facility] HRM AMBULATORY MONITORS    HRM Ambulatory Monitoring Team:  Schedule on appropriate template and check-in.   Clinic Placement Schedule on clinic location [facility] schedule   Home Enrollment Schedule on Home Enrollment schedule ([facility])   Given to patient in clinic for self-placement Schedule on Home Enrollment schedule ([facility])   Inpatient Schedule on [facility] AMBULATORY MONITORING template   2. Please enroll with appropriate vendor.

## 2024-02-17 ENCOUNTER — Encounter: Admit: 2024-02-17 | Discharge: 2024-02-17 | Payer: MEDICARE

## 2024-02-21 ENCOUNTER — Encounter: Admit: 2024-02-21 | Discharge: 2024-02-21 | Payer: MEDICARE

## 2024-02-21 NOTE — Telephone Encounter [36]
-----   Message from Garnette HERO sent at 02/05/2024 10:31 AM CST -----  Regarding: FW: phone call  Follow up  on  ----- Message -----  From: Quin Elspeth BIRCH, MD  Sent: 02/05/2024   8:56 AM CST  To: Garnette Simpers  Subject: phone call                                       Can you call [facility] in about a week to check on home BP.  We'll want to re-start lisinopril.    Also, I ordered a lipid profile at [facility] for late January.  Can you FAX that over to the lab?  Thanks.

## 2024-02-21 NOTE — Telephone Encounter [36]
 Called pt went directly to voicemail requested call back with bp readings.

## 2024-03-03 ENCOUNTER — Encounter: Admit: 2024-03-03 | Discharge: 2024-03-03 | Payer: MEDICARE

## 2024-03-03 NOTE — Telephone Encounter [36]
 Called pt to follow up on bp states has been running high.    OV 12/12 He has had high blood pressure over the last 3 days. 171 systolic  bp in clinic 140/86.  Pt is concerned about BP; it has been running high at home. Pt is 140/86 in clinic today.  Dr Darland on 11/17 decreased lisinopril from 40mg  daily to 20mg  daily. I have added in 10mg  daily for a total dose of 30mg  daily.    He reports bp today of 126/60.      Patient encouraged to continue to monitor call if consistently over 130/80.

## 2024-03-03 NOTE — Telephone Encounter [36]
-----   Message from Garnette HERO sent at 02/05/2024 10:31 AM CST -----  Regarding: FW: phone call  Follow up  on  ----- Message -----  From: Quin Elspeth BIRCH, MD  Sent: 02/05/2024   8:56 AM CST  To: Garnette Simpers  Subject: phone call                                       Can you call [facility] in about a week to check on home BP.  We'll want to re-start lisinopril.    Also, I ordered a lipid profile at [facility] for late January.  Can you FAX that over to the lab?  Thanks.

## 2024-03-09 ENCOUNTER — Encounter: Admit: 2024-03-09 | Discharge: 2024-03-09 | Payer: MEDICARE

## 2024-03-09 ENCOUNTER — Ambulatory Visit: Admit: 2024-03-09 | Discharge: 2024-03-09 | Payer: MEDICARE

## 2024-03-09 VITALS — BP 130/55 | HR 68 | Temp 98.40000°F | Ht 72.0 in | Wt 225.6 lb

## 2024-03-09 DIAGNOSIS — I6523 Occlusion and stenosis of bilateral carotid arteries: Principal | ICD-10-CM

## 2024-03-09 NOTE — Progress Notes [1]
 Date of Service: 03/09/2024              Chief Complaint   Patient presents with    Other     CAS       History of Present Illness  Karl Knapp is a 74 year old male who presented to the ED with dyspnea and then developed HTN, word finding difficulty, and was thought to be having a stroke, though no MRI findings or CTP findings were identified at the beginning of November.  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  There was no evidence of ulcerated plaque at the site on my independent review.  He has not had additional episodes since then.  He is on aspirin and Plavix.  He is on rosuvastatin.      Past Medical History:    Adverse drug reaction    Arthritis    Atypical chest pain    Cardiac dysrhythmia    Cardiomyopathy    Cervical stenosis of spine    Chest pain    CHF (congestive heart failure) (CMS-HCC)    Coronary atherosclerosis    Depression    Diabetes mellitus type 2, insulin  dependent (CMS-HCC)    Dizziness    Dyspnea    Esophageal stricture    Gastrointestinal bleed    History of back surgery    HX: anticoagulation    Hyperlipemia    Ketoacidosis    Kidney disease    Myoclonic epilepsy (CMS-HCC)    Primary hypertension    Primary idiopathic dilated cardiomyopathy (CMS-HCC)    Sleep apnea, obstructive    Syncope and collapse    TIA (transient ischemic attack)       Surgical History:   Procedure Laterality Date    NECK SURGERY  08/16/2008    CATARACT REMOVAL  2012    HX SHOULDER ARTHROSCOPY Right 06/03/2013    rotator cuff    CARDIAC CATHERIZATION  05/24/2015    UPPER GASTROINTESTINAL ENDOSCOPY  2019    COLONOSCOPY  2019    HX CHOLECYSTECTOMY  2019    ANGIOGRAPHY CORONARY ARTERY WITH LEFT HEART CATHETERIZATION N/A 02/04/2023    Performed by Erling Camellia RAMAN, MD at [facility] CATH LAB    PERCUTANEOUS CORONARY STENT PLACEMENT WITH ANGIOPLASTY N/A 02/04/2023    Performed by Erling Camellia RAMAN, MD at [facility] CATH LAB    PERCUTANEOUS CORONARY ATHERECTOMY/ STENT PLACEMENT/ ANGIOPLASTY OF CHRONIC TOTAL OCCLUSION N/A 02/25/2023    Performed by Emilia Curtistine PARAS, MD at [facility] CATH LAB    PERCUTANEOUS CORONARY STENT PLACEMENT WITH ANGIOPLASTY N/A 03/27/2023    Performed by Erling Camellia RAMAN, MD at [facility] CATH LAB    ANGIOGRAPHY CORONARY ARTERY WITH LEFT HEART CATHETERIZATION N/A 11/12/2023    Performed by Emilia Curtistine PARAS, MD at [facility] CATH LAB    PERCUTANEOUS CORONARY STENT PLACEMENT WITH ANGIOPLASTY N/A 11/12/2023    Performed by Emilia Curtistine PARAS, MD at [facility] CATH LAB    CARDIOVASCULAR STRESS TEST      CORONARY ANGIOPLASTY      ECHOCARDIOGRAM PROCEDURE      ELECTROCARDIOGRAM      HX CORONARY STENT PLACEMENT      SKIN CANCER EXCISION      left hand and left [id number]       Allergies:  Allergies[1]    Medication List:   acyclovir (ZOVIRAX) 400 mg tablet Take one tablet by mouth  every 24 hours. (Patient not taking: Reported on 03/09/2024)    amLODIPine (NORVASC) 10 mg tablet Take one tablet by mouth daily.    aspirin (ASPIRIN CHILDRENS) 81 mg chewable tablet Chew one tablet by mouth daily. Indications: treatment to prevent peripheral artery thromboembolism    atorvastatin (LIPITOR) 80 mg tablet Take one tablet by mouth at bedtime daily. (Patient not taking: Reported on 03/09/2024)    baclofen 5 mg tablet Take one tablet by mouth three times daily.    carvediloL (COREG) 12.5 mg tablet Take one tablet by mouth twice daily with meals. Take with food.    cholecalciferol (VITAMIN D -3) 1,000 units tablet Take two tablets by mouth daily.    clopiDOGreL (PLAVIX) 75 mg tablet Take one tablet by mouth daily. Indications: blood clot prevention following percutaneous coronary intervention    dapagliflozin propanediol (FARXIGA) 10 mg tablet Take one tablet by mouth daily.    DEXCOM G7 SENSOR sensor device Use one each as directed every 10 days. Indications: type 2 diabetes mellitus    divalproex (DEPAKOTE ER) 500 mg ER tablet Take one tablet by mouth twice daily. Take with food.    ergocalciferol  (vitamin D2) (VITAMIN D2) 1,250 mcg (50,000 unit) capsule Take one capsule by mouth every 7 days.    escitalopram  oxalate (LEXAPRO ) 20 mg tablet TAKE ONE TABLET BY MOUTH EVERY DAY    ezetimibe  (ZETIA ) 10 mg tablet Take one tablet by mouth daily.    gabapentin (NEURONTIN) 600 mg tablet Take one tablet by mouth twice daily.    glucagon  (GVOKE HYPOPEN  2-PACK) 1 mg/0.2 mL syringe Inject 0.2 mL under the skin as Needed. Inject 1 mg into the upper arm, thigh, or buttocks if needed for severe hypoglycemia and then call 911.    HYDROcodone/acetaminophen(+) (NORCO) 10/325 mg tablet Take one tablet by mouth at bedtime daily. (Patient not taking: Reported on 03/09/2024)    insulin  glargine (LANTUS  SOLOSTAR U-100 INSULIN ) 100 unit/mL (3 mL) subcutaneous PEN Inject forty Units under the skin at bedtime daily. FOR INSULIN  PUMP FAILURE BACK UP PLAN    insulin  lispro (HUMALOG  U-100 INSULIN ) 100 unit/mL injection Inject one hundred thirty five Units under the skin daily. Up to 135 units per day using insulin  pump. E11.9  Indications: type 2 diabetes mellitus    linaCLOtide  (LINZESS ) 145 mcg capsule Take one capsule by mouth daily 30 minutes before breakfast.    lisinopril (PRINIVIL; ZESTRIL) 20 mg tablet Take 1 Tab by mouth daily.    LORazepam  (ATIVAN ) 1 mg tablet Take 1 mg when nausea begins    magnesium oxide (MAGOX) 400 mg (241.3 mg magnesium) tablet Take one tablet by mouth twice daily.    metFORMIN -XR (GLUCOPHAGE  XR) 500 mg extended release tablet Take two tablets by mouth daily with dinner. Indications: type 2 diabetes mellitus    ondansetron (ZOFRAN ODT) 4 mg rapid dissolve tablet Dissolve  by mouth as Needed.    oxyCODONE 10 mg tablet Take one tablet by mouth three times daily as needed.    pantoprazole DR (PROTONIX) 40 mg tablet Take one tablet by mouth daily.    promethazine  (PHENERGAN ) 25 mg tablet Take one tablet by mouth every 6 hours as needed for Nausea or Vomiting. rosuvastatin (CRESTOR) 40 mg tablet Take one tablet by mouth at bedtime daily.    zinc  acetate (GALZIN) 50 mg (zinc ) oral capsule Take one capsule by mouth daily.       Social History:   reports that he has never smoked. He has never  used smokeless tobacco. He reports that he does not drink alcohol and does not use drugs.    Family History   Problem Relation Name Age of Onset    Heart Attack Mother      High Cholesterol Mother      Cancer Father      Diabetes Father      Heart Attack Father      High Cholesterol Father      Diabetes Sister      Diabetes Brother      Heart Attack Brother      High Cholesterol Brother      Coronary Artery Disease Brother      Depression Sister Rollene blush     Kidney Disease Father Ayesha Saxon         cancer       Review of Systems  See HPI        Vitals:    03/09/24 1336 03/09/24 1338   BP: 129/57 130/55   BP Source: Arm, Right Upper Arm, Left Upper   Pulse: 67 68   Temp: 36.9 ?C (98.4 ?F)    TempSrc: Temporal    PainSc: Six    Weight: 102.3 kg (225 lb 9.6 oz)    Height: 182.9 cm (6')      Body mass index is 30.6 kg/m?SABRA     Physical Exam    General: alert and oriented, no acute distress, conversant  Neck: supple, no JVD, no carotid bruit  CV: regular, no murmur  Resp: clear to auscultation bilaterally  Abdomen: soft, nontender, nondistended, no guarding or rebound, no pulsatile mass  Ext: no clubbing, cyanosis or edema  Vasc: 2+ radial, femoral, DP/PT pulses b/l  Skin: no rashes, lesions, or wounds  Neuro: sensation and motor grossly intact throughout  Psych: normal mood and affect      Assessment and Plan:    1. Bilateral carotid artery stenosis  VAS US  DUPLEX SCAN CAROTID BILATERAL           74 year old male with hypertension, hyperlipidemia, diabetes, CAD and peripheral arterial disease who presents today for evaluation of carotid and right vertebral stenosis.  He had strokelike symptoms in November for which the cause has not been identified.  I independently reviewed the images of his CTA neck which does show stenosis of the right vertebral artery at its origin which appears to be high-grade, however the bilateral carotid stenosis does not appear to be high-grade and appears to be calcified without evidence of ulcerated plaquing.  We will obtain a carotid duplex to further quantify the percent stenosis in the carotid arteries bilaterally.  Would continue aspirin and Plavix as well as statin therapy with a goal total cholesterol less than 180 and LDL less than 70.  Assuming that his carotid duplex does not demonstrate greater than 70% stenosis, would plan for continuation of medical management and follow-up duplex in 6 months with an office visit.  This plan was discussed in detail with the patient who understood and was agreeable to proceed.    Karl Hakim, DO       [1]   Allergies  Allergen Reactions    Morphine RASH    Sulfa (Sulfonamide Antibiotics) HIVES    Fentanyl AGITATION

## 2024-03-09 NOTE — Patient Instructions [37]
 Dr. Lucrecia has examined you, reviewed your testing (CT scans) but she recommends getting an ultrasound of the carotid arteries in your neck to further evaluate the narrowing/blockage.  Once the testing is done, she will review the results and we will call you with her recommendations.    Things you can do to help reduce the risk of disease progression:  - Remain as active as possible  - Continue medications as prescribed (aspirin,atorvastatin, clopidogrel)  - Monitor and keep good control of blood pressures, cholesterol levels and if diabetic, blood sugars.  - If you smoke, continue efforts to stop smoking - smoking directly affects your blood vessels negatively     If symptoms worsen, please call our office for re-evaluation at [phone]         Carotid Artery Problems: Stroke  The carotid arteries are large blood vessels that carry blood to the brain. When these arteries are healthy, the brain gets all the oxygen and nutrients it needs to function well. But if the carotid arteries are damaged, this can greatly increase your risk of a stroke. A stroke is a sudden loss of brain function caused by a lack of blood flow and oxygen.    How artery damage can lead to a stroke  A healthy carotid artery is smooth on the inside, like a tube. But health problems such as high blood pressure, diabetes, and smoking can damage the inside of the artery wall and make it rough. This lets fatty deposits called plaque build up on the artery wall. Blood clots called emboli may also form on the plaque. If pieces of plaque or emboli break off, they can flow in the blood and get stuck in a small blood vessel in the brain. This blocks the blood flow to a part of the brain, and causes a stroke.  Symptoms of a stroke  Below are common symptoms of a stroke.  Call 911 right away if you have any of these symptoms. Fast medical treatment for a stroke is vital. The longer you wait to get help, the more damage a stroke can do.  Sudden numbness or weakness of the face, arm, or leg, especially on one side of the body  Sudden confusion or trouble speaking or understanding other people  Sudden trouble seeing in 1 or both eyes  Sudden trouble walking, dizziness, or loss of balance or coordination  Sudden, severe headache with no known cause  Sudden new seizures  Transient ischemic attack (TIA)  A transient ischemic attack (TIA) is a mini-stroke. It?s a warning sign that a larger stroke may happen in the near future. A TIA is when an artery to the brain is blocked for a brief time. This will cause symptoms just like those that happen with a stroke. But with a TIA, they last a short period of time, from a few seconds to a few hours. Never ignore any TIA or stroke symptoms.  Call 911 right away.  B.E. F.A.S.T.  Reminder for Stroke Signs     B.E.F.A.S.T. is an easy way to remember the warning signs of a stroke, and what to do if someone near you is experiencing them.       B.E. F.A.S.T. Stands for?  B  -  Balance  Is there a sudden difficulty with balance or coordination? Is walking or sitting difficult?  E  -  Eyes  Is there a sudden change in eyesight such as blurred vision, double vision, or a loss of vision in  one or both eyes without pain?   F  -  Face   Does one side of the face droop or is it numb?  Ask the person to smile.  A  -  Arm   Is one arm weak or numb?  Ask the person to raise both arms.  Does one arm drift downward?  S  -  Speech   Is speech slurred or difficult to understand?  Are they unable to speak at all?  Ask the person to repeat is simple sentence like ?It is a bright and sunny day.?    T  -  Time to call 911!  If a person shows any of these symptoms, even if they go away, call 911 to get them to the hospital immediately.  Time is very important.  The sooner they get to the hospital, the more likely they are to receive stroke reversing and potentially life-saving treatment.        [employer] last reviewed this educational content on 03/05/2020    ? 2000-2019 [employer] [address] [place], [place] [address]. All rights reserved. This information is not intended as a substitute for professional medical care. Always follow your healthcare professional's instructions. This information has been modified by your health care provider with permission from the publisher.

## 2024-03-25 ENCOUNTER — Encounter: Admit: 2024-03-25 | Discharge: 2024-03-25 | Payer: MEDICARE

## 2024-03-26 ENCOUNTER — Encounter: Admit: 2024-03-26 | Discharge: 2024-03-26 | Payer: MEDICARE

## 2024-04-06 ENCOUNTER — Encounter: Admit: 2024-04-06 | Discharge: 2024-04-06 | Payer: MEDICARE

## 2024-04-07 ENCOUNTER — Ambulatory Visit: Admit: 2024-04-07 | Discharge: 2024-04-08 | Payer: MEDICARE

## 2024-04-07 ENCOUNTER — Encounter: Admit: 2024-04-07 | Discharge: 2024-04-07 | Payer: MEDICARE

## 2024-04-07 DIAGNOSIS — I10 Essential (primary) hypertension: Secondary | ICD-10-CM

## 2024-04-07 DIAGNOSIS — N2581 Secondary hyperparathyroidism of renal origin: Secondary | ICD-10-CM

## 2024-04-07 DIAGNOSIS — N183 Stage 3 chronic kidney disease, unspecified whether stage 3a or 3b CKD (CMS-HCC): Principal | ICD-10-CM

## 2024-04-07 NOTE — Patient Instructions [37]
Please do not hesitate to call with any questions.  My nurse Dahlia Client can be reached [phone].     Please go to the lab in the next month     Return to clinic in 1 year
